# Patient Record
Sex: Female | Born: 2000 | Race: White | Hispanic: No | Marital: Single | Smoking: Light tobacco smoker
Health system: Southern US, Community
[De-identification: ages and names within clinical notes are randomized; demographics above are authoritative.]

---
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
## 2020-01-01 ENCOUNTER — Emergency Department: Payer: BC Managed Care – PPO

## 2020-01-01 ENCOUNTER — Encounter: Payer: Self-pay | Admitting: Internal Medicine

## 2020-01-01 ENCOUNTER — Inpatient Hospital Stay
Admission: EM | Admit: 2020-01-01 | Discharge: 2020-01-04 | DRG: 872 | Disposition: A | Discharge status: Home / Self Care | Payer: BC Managed Care – PPO | Attending: Internal Medicine | Admitting: Internal Medicine

## 2020-01-01 ENCOUNTER — Other Ambulatory Visit: Payer: Self-pay

## 2020-01-01 DIAGNOSIS — A419 Sepsis, unspecified organism: Secondary | ICD-10-CM | POA: Diagnosis present

## 2020-01-01 DIAGNOSIS — D509 Iron deficiency anemia, unspecified: Secondary | ICD-10-CM | POA: Diagnosis present

## 2020-01-01 DIAGNOSIS — A4151 Sepsis due to Escherichia coli [E. coli]: Principal | ICD-10-CM | POA: Diagnosis present

## 2020-01-01 DIAGNOSIS — R109 Unspecified abdominal pain: Secondary | ICD-10-CM

## 2020-01-01 DIAGNOSIS — N390 Urinary tract infection, site not specified: Secondary | ICD-10-CM

## 2020-01-01 DIAGNOSIS — Z20822 Contact with and (suspected) exposure to covid-19: Secondary | ICD-10-CM | POA: Diagnosis present

## 2020-01-01 DIAGNOSIS — N16 Renal tubulo-interstitial disorders in diseases classified elsewhere: Secondary | ICD-10-CM | POA: Diagnosis present

## 2020-01-01 DIAGNOSIS — E876 Hypokalemia: Secondary | ICD-10-CM | POA: Diagnosis present

## 2020-01-01 DIAGNOSIS — N12 Tubulo-interstitial nephritis, not specified as acute or chronic: Secondary | ICD-10-CM

## 2020-01-01 LAB — URINALYSIS, COMPLETE (UACMP) WITH MICROSCOPIC
Bilirubin Urine: NEGATIVE
Glucose, UA: NEGATIVE mg/dL
Hgb urine dipstick: NEGATIVE
Ketones, ur: 5 mg/dL — AB
Nitrite: NEGATIVE
Protein, ur: 30 mg/dL — AB
Specific Gravity, Urine: 1.011 (ref 1.005–1.030)
WBC, UA: >50 WBC/hpf — ABNORMAL HIGH (ref 0–5)
pH: 6 (ref 5.0–8.0)

## 2020-01-01 LAB — COMPREHENSIVE METABOLIC PANEL
ALT: 13 U/L (ref 0–44)
AST: 17 U/L (ref 15–41)
Albumin: 4.3 g/dL (ref 3.5–5.0)
Alkaline Phosphatase: 77 U/L (ref 38–126)
Anion gap: 14 (ref 5–15)
BUN: 10 mg/dL (ref 6–20)
CO2: 20 mmol/L — ABNORMAL LOW (ref 22–32)
Calcium: 9 mg/dL (ref 8.9–10.3)
Chloride: 101 mmol/L (ref 98–111)
Creatinine, Ser: 1.13 mg/dL — ABNORMAL HIGH (ref 0.44–1.00)
GFR calc Af Amer: >60 mL/min (ref >60)
GFR calc non Af Amer: >60 mL/min (ref >60)
Glucose, Bld: 106 mg/dL — ABNORMAL HIGH (ref 70–99)
Potassium: 3.4 mmol/L — ABNORMAL LOW (ref 3.5–5.1)
Sodium: 135 mmol/L (ref 135–145)
Total Bilirubin: 1.3 mg/dL — ABNORMAL HIGH (ref 0.3–1.2)
Total Protein: 8.7 g/dL — ABNORMAL HIGH (ref 6.5–8.1)

## 2020-01-01 LAB — CBC WITH DIFFERENTIAL/PLATELET
Abs Immature Granulocytes: 0.09 10*3/uL — ABNORMAL HIGH (ref 0.00–0.07)
Basophils Absolute: 0 10*3/uL (ref 0.0–0.1)
Basophils Relative: 0 %
Eosinophils Absolute: 0 10*3/uL (ref 0.0–0.5)
Eosinophils Relative: 0 %
HCT: 35.5 % — ABNORMAL LOW (ref 36.0–46.0)
Hemoglobin: 12.2 g/dL (ref 12.0–15.0)
Immature Granulocytes: 0 %
Lymphocytes Relative: 9 %
Lymphs Abs: 1.9 10*3/uL (ref 0.7–4.0)
MCH: 30 pg (ref 26.0–34.0)
MCHC: 34.4 g/dL (ref 30.0–36.0)
MCV: 87.2 fL (ref 80.0–100.0)
Monocytes Absolute: 1.1 10*3/uL — ABNORMAL HIGH (ref 0.1–1.0)
Monocytes Relative: 5 %
Neutro Abs: 18 10*3/uL — ABNORMAL HIGH (ref 1.7–7.7)
Neutrophils Relative %: 86 %
Platelets: 270 10*3/uL (ref 150–400)
RBC: 4.07 MIL/uL (ref 3.87–5.11)
RDW: 11.6 % (ref 11.5–15.5)
WBC: 21.2 10*3/uL — ABNORMAL HIGH (ref 4.0–10.5)
nRBC: 0 % (ref 0.0–0.2)

## 2020-01-01 LAB — LACTIC ACID, PLASMA: Lactic Acid, Venous: 2.4 mmol/L (ref 0.5–1.9)

## 2020-01-01 LAB — POCT PREGNANCY, URINE: Preg Test, Ur: NEGATIVE

## 2020-01-01 MED ORDER — IBUPROFEN 600 MG PO TABS
600.0000 mg | ORAL_TABLET | Freq: Four times a day (QID) | ORAL | Status: DC | PRN
  Administered 2020-01-02: 05:00:00 600 mg via ORAL
  Filled 2020-01-01: qty 1

## 2020-01-01 MED ORDER — SODIUM CHLORIDE 0.9 % IV BOLUS
2000.0000 mL | Freq: Once | INTRAVENOUS | Status: AC
  Administered 2020-01-01: 22:00:00 2000 mL via INTRAVENOUS

## 2020-01-01 MED ORDER — ACETAMINOPHEN 325 MG PO TABS
650.0000 mg | ORAL_TABLET | Freq: Four times a day (QID) | ORAL | Status: DC | PRN
  Administered 2020-01-02 (×3): 650 mg via ORAL
  Filled 2020-01-01 (×3): qty 2

## 2020-01-01 MED ORDER — SODIUM CHLORIDE 0.9 % IV SOLN
1.0000 g | Freq: Once | INTRAVENOUS | Status: AC
  Administered 2020-01-01: 1 g via INTRAVENOUS
  Filled 2020-01-01: qty 10

## 2020-01-01 MED ORDER — SODIUM CHLORIDE 0.9 % IV SOLN: Freq: Once | INTRAVENOUS | Status: AC

## 2020-01-01 MED ORDER — ONDANSETRON HCL 4 MG PO TABS
4.0000 mg | ORAL_TABLET | Freq: Four times a day (QID) | ORAL | Status: DC | PRN
  Administered 2020-01-02 – 2020-01-04 (×3): 4 mg via ORAL
  Filled 2020-01-01 (×3): qty 1

## 2020-01-01 MED ORDER — ACETAMINOPHEN 500 MG PO TABS: 1000.0000 mg | ORAL_TABLET | Freq: Once | ORAL | Status: DC

## 2020-01-01 MED ORDER — POTASSIUM CHLORIDE 20 MEQ/15ML (10%) PO SOLN
40.0000 meq | Freq: Once | ORAL | Status: AC
  Administered 2020-01-02: 02:00:00 40 meq via ORAL
  Filled 2020-01-01 (×2): qty 30

## 2020-01-01 MED ORDER — ENOXAPARIN SODIUM 40 MG/0.4ML ~~LOC~~ SOLN
40.0000 mg | SUBCUTANEOUS | Status: DC
  Filled 2020-01-01: qty 0.4

## 2020-01-01 MED ORDER — SODIUM CHLORIDE 0.9% FLUSH: 3.0000 mL | Freq: Once | INTRAVENOUS | Status: DC

## 2020-01-01 MED ORDER — FENTANYL CITRATE (PF) 100 MCG/2ML IJ SOLN
50.0000 ug | INTRAMUSCULAR | Status: DC | PRN
  Administered 2020-01-01: 20:00:00 50 ug via INTRAVENOUS
  Filled 2020-01-01: qty 2

## 2020-01-01 MED ORDER — IBUPROFEN 600 MG PO TABS
600.0000 mg | ORAL_TABLET | Freq: Once | ORAL | Status: AC
  Administered 2020-01-01: 22:00:00 600 mg via ORAL
  Filled 2020-01-01: qty 1

## 2020-01-01 MED ORDER — ONDANSETRON HCL 4 MG/2ML IJ SOLN: 4.0000 mg | Freq: Four times a day (QID) | INTRAMUSCULAR | Status: DC | PRN

## 2020-01-01 MED ORDER — ACETAMINOPHEN 650 MG RE SUPP: 650.0000 mg | Freq: Four times a day (QID) | RECTAL | Status: DC | PRN

## 2020-01-01 MED ORDER — SODIUM CHLORIDE 0.9 % IV SOLN
1.0000 g | INTRAVENOUS | Status: DC
  Administered 2020-01-02 – 2020-01-04 (×3): 1 g via INTRAVENOUS
  Filled 2020-01-01 (×3): qty 1

## 2020-01-01 NOTE — ED Notes (Signed)
ED Provider Posey Pronto at bedside.

## 2020-01-01 NOTE — ED Notes (Signed)
MD aware that patient is refusing a second IV or blood draw, is OK with the 1 set of cultures obtained

## 2020-01-01 NOTE — ED Notes (Signed)
Pt provide with meal tray and water by this RN per MD Posey Pronto.

## 2020-01-01 NOTE — ED Provider Notes (Signed)
[facility] Emergency Department Provider Note  ____________________________________________   First MD Initiated Contact with Patient 01/01/20 2107     (approximate)  I have reviewed the triage vital signs and the nursing notes.  History  Chief Complaint Flank Pain    HPI Cindy Alexander is a 19 y.o. female who presents emergency department for lower back pain, right flank pain.  Symptoms have been ongoing for several weeks.  Pain is primarily located to her right flank/back.  8/10 in severity, described as an aching.  No radiation.  No alleviating/aggravating factors.  Severity seems to wax and wane with no identifiable trigger.  She presented to urgent care today, where she was diagnosed with pyelonephritis and referred to the emergency department for further evaluation.  She denies any frank dysuria or malodorous urine.  No nausea, vomiting, diarrhea.  She denies any difficulty breathing, cough, shortness of breath, or sick contacts.  Patient does report a fever at urgent care, temp 99 Fahrenheit on arrival after taking APAP.   Past Medical Hx No past medical history on file.  Problem List There are no problems to display for this patient.   Past Surgical Hx Non-contributory.  Medications Prior to Admission medications   Not on File    Allergies Patient has no allergy information on record.  Family Hx No family history on file.  Social Hx Social History   Tobacco Use  . Smoking status: Not on file  Substance Use Topics  . Alcohol use: Not on file  . Drug use: Not on file     Review of Systems  Constitutional: Negative for fever, chills. Eyes: Negative for visual changes. ENT: Negative for sore throat. Cardiovascular: Negative for chest pain. Respiratory: Negative for shortness of breath. Gastrointestinal: Negative for nausea, vomiting.  Genitourinary: Negative for dysuria. + RIGHT flank pain Musculoskeletal: Negative for leg  swelling. Skin: Negative for rash. Neurological: Negative for for headaches.   Physical Exam  Vital Signs: ED Triage Vitals  Enc Vitals Group     BP 01/01/20 2007 (!) 139/110     Pulse Rate 01/01/20 2007 (!) 128     Resp 01/01/20 2007 (!) 25     Temp 01/01/20 2007 99 F (37.2 C)     Temp Source 01/01/20 2007 Oral     SpO2 01/01/20 2007 100 %     Weight 01/01/20 2008 103 lb (46.7 kg)     Height 01/01/20 2008 5\' 5"  (1.651 m)     Head Circumference --      Peak Flow --      Pain Score 01/01/20 2007 8     Pain Loc --      Pain Edu? --      Excl. in [place]? --     Constitutional: Alert and oriented.  Head: Normocephalic. Atraumatic. Eyes: Conjunctivae clear. Sclera anicteric. Nose: No congestion. No rhinorrhea. Mouth/Throat: Wearing mask.  Neck: No stridor.   Cardiovascular: Normal rate, regular rhythm. Extremities well perfused. Respiratory: Normal respiratory effort.  Lungs CTAB. Gastrointestinal: Soft. Non-tender. Non-distended.  Notable R CVA tenderness. Musculoskeletal: No lower extremity edema. No deformities. Neurologic:  Normal speech and language. No gross focal neurologic deficits are appreciated.  Skin: Skin is warm, dry and intact. No rash noted. Psychiatric: Mood and affect are appropriate for situation.    Radiology  CT renal: IMPRESSION: 1. There is some mild right-sided perinephric fat stranding without evidence for significant right-sided hydronephrosis or radiopaque kidney stone. This could be secondary  to a recently passed stone or pyelonephritis. Correlate with urinalysis. Examination was limited by lack of IV contrast. 2. Small volume of pelvic free fluid is likely physiologic or reactive. 3. IUD in place. 4. The appendix is not reliably identified.    Procedures  Procedure(s) performed (including critical care):  .Critical Care Performed by: Lilia Pro., MD Authorized by: Lilia Pro., MD   Critical care provider statement:     Critical care time (minutes):  45   Critical care was necessary to treat or prevent imminent or life-threatening deterioration of the following conditions:  Sepsis   Critical care was time spent personally by me on the following activities:  Discussions with consultants, evaluation of patient's response to treatment, examination of patient, ordering and performing treatments and interventions, ordering and review of laboratory studies, ordering and review of radiographic studies, pulse oximetry, re-evaluation of patient's condition, obtaining history from patient or surrogate and review of old charts     Initial Impression / Assessment and Plan / ED Course  19 y.o. female who presents to the ED for R flank pain, concerning for pyelonephritis  Ddx: pyelonephritis, stone  Labs reveal significant leukocytosis to 21.  AKI with creatinine 1.13.  Lactic acid 2.4.  UA with large LE, WBCs, bacteria.  Given her lab values, tachycardia on arrival, patient does meet sepsis criteria.  Will give IV fluids, antibiotics, plan for admission.  Patient agreeable with plan.  Discussed with hospitalist for admission.  CT renal ordered to rule out concomitant stone, negative for nephrolithiasis, consistent with pyelonephritis.   Final Clinical Impression(s) / ED Diagnosis  Final diagnoses:  Right flank pain  Pyelonephritis       Note:  This document was prepared using Dragon voice recognition software and may include unintentional dictation errors.   Lilia Pro., MD 01/01/20 [phone]

## 2020-01-01 NOTE — ED Notes (Signed)
Patient transported to CT 

## 2020-01-01 NOTE — ED Notes (Signed)
Pt refuses 2nd IV and blow draw at this time. EDP [facility] made aware.

## 2020-01-01 NOTE — ED Notes (Signed)
Pt able to ambulate to bathroom with a steady gait.

## 2020-01-01 NOTE — H&P (Signed)
History and Physical    Cindy Alexander [id number] DOB: December 29, 2000 DOA: 01/01/2020  PCP: Patient, No Pcp Per  Patient coming from: Home  I have personally briefly reviewed patient's old medical records in [place]  Chief Complaint: Right flank pain  HPI: Cindy Alexander is a 19 y.o. female without known significant medical history who presents to the ED for evaluation of right flank pain.  Patient states over the last several weeks she has been having intermittent flank pain.  She first noted having left-sided flank pain on and off which has been resolving on its own.  She now has developed severe right-sided flank pain.  She reports associated fevers, chills, diaphoresis, abdominal pain.  She was having nausea without emesis.  She denies any dysuria or polyuria.  She denies any chest pain or dyspnea.  She reports occasional tobacco use with maximum of 4 cigarettes/month.  She uses marijuana occasionally.  She reports occasional alcohol use.  She denies any cocaine or IV drug use.  She reports a history of epilepsy in her brother.  She denies any known allergies to medication.  ED Course:  Initial vitals showed BP 139/110, pulse 128, RR 25, temp 99.0 Fahrenheit, SPO2 100% on room air.  Labs are notable for WBC 21.2, hemoglobin 12.2, platelets 270,000, lactic acid 2.4, potassium 3.4, sodium 135, bicarb 20, BUN 10, creatinine 1.13, AST 17, ALT 13, alk phos 77, total bilirubin 1.3, serum glucose 106.  Urinalysis showed negative nitrites, large leukocytes, 0-5 RBCs, > 50 WBCs, rare bacteria microscopy.  Urine pregnancy test is negative.  Blood and urine cultures were obtained and pending.  SARS-CoV-2 PCR test was ordered and pending.  CT renal stone study showed right-sided perinephric fat stranding without obvious radiopaque kidney stone or hydronephrosis.  Patient was given 2 L normal saline, fentanyl, ibuprofen, and order to receive IV ceftriaxone.  The hospitalist service was  consulted admit for further evaluation and management.  Review of Systems: All systems reviewed and are negative except as documented in history of present illness above.   History reviewed. No pertinent past medical history.  History reviewed. No pertinent surgical history.  Social History:  reports that she has been smoking. She has never used smokeless tobacco. She reports current alcohol use. She reports current drug use. Drug: Marijuana.  No Known Allergies  Family History  Problem Relation Age of Onset  . Epilepsy Brother      Prior to Admission medications   Not on File    Physical Exam: Vitals:   01/01/20 2110 01/01/20 2124 01/01/20 2130 01/01/20 2217  BP:  103/66 107/71   Pulse: (!) 110 96 95   Resp:   19   Temp:    98.5 F (36.9 C)  TempSrc:    Oral  SpO2: 99% 98% 99%   Weight:      Height:       Constitutional: Resting in bed with head elevated, NAD, calm, comfortable Eyes: PERRL, lids and conjunctivae normal ENMT: Mucous membranes are moist. Posterior pharynx clear of any exudate or lesions.Normal dentition.  Neck: normal, supple, no masses. Respiratory: clear to auscultation bilaterally, no wheezing, no crackles. Normal respiratory effort. No accessory muscle use.  Cardiovascular: Regular rate and rhythm, no murmurs / rubs / gallops. No extremity edema. 2+ pedal pulses. Abdomen: Bilateral CVA tenderness, generalized abdominal tenderness to palpation, no masses palpated. No hepatosplenomegaly. Musculoskeletal: no clubbing / cyanosis. No joint deformity upper and lower extremities. Good ROM, no contractures. Normal  muscle tone.  Skin: no rashes, lesions, ulcers. No induration Neurologic: CN 2-12 grossly intact. Sensation intact, Strength 5/5 in all 4.  Psychiatric: Normal judgment and insight. Alert and oriented x 3. Normal mood.   Labs on Admission: I have personally reviewed following labs and imaging studies  CBC: Recent Labs  Lab 01/01/20 2025    WBC 21.2*  NEUTROABS 18.0*  HGB 12.2  HCT 35.5*  MCV 87.2  PLT 562   Basic Metabolic Panel: Recent Labs  Lab 01/01/20 2025  NA 135  K 3.4*  CL 101  CO2 20*  GLUCOSE 106*  BUN 10  CREATININE 1.13*  CALCIUM 9.0   GFR: Estimated Creatinine Clearance: 59 mL/min (A) (by C-G formula based on SCr of 1.13 mg/dL (H)). Liver Function Tests: Recent Labs  Lab 01/01/20 2025  AST 17  ALT 13  ALKPHOS 77  BILITOT 1.3*  PROT 8.7*  ALBUMIN 4.3   No results for input(s): LIPASE, AMYLASE in the last 168 hours. No results for input(s): AMMONIA in the last 168 hours. Coagulation Profile: No results for input(s): INR, PROTIME in the last 168 hours. Cardiac Enzymes: No results for input(s): CKTOTAL, CKMB, CKMBINDEX, TROPONINI in the last 168 hours. BNP (last 3 results) No results for input(s): PROBNP in the last 8760 hours. HbA1C: No results for input(s): HGBA1C in the last 72 hours. CBG: No results for input(s): GLUCAP in the last 168 hours. Lipid Profile: No results for input(s): CHOL, HDL, LDLCALC, TRIG, CHOLHDL, LDLDIRECT in the last 72 hours. Thyroid Function Tests: No results for input(s): TSH, T4TOTAL, FREET4, T3FREE, THYROIDAB in the last 72 hours. Anemia Panel: No results for input(s): VITAMINB12, FOLATE, FERRITIN, TIBC, IRON, RETICCTPCT in the last 72 hours. Urine analysis:    Component Value Date/Time   COLORURINE YELLOW (A) 01/01/2020 2123   APPEARANCEUR HAZY (A) 01/01/2020 2123   LABSPEC 1.011 01/01/2020 2123   PHURINE 6.0 01/01/2020 2123   GLUCOSEU NEGATIVE 01/01/2020 2123   HGBUR NEGATIVE 01/01/2020 2123   BILIRUBINUR NEGATIVE 01/01/2020 2123   KETONESUR 5 (A) 01/01/2020 2123   PROTEINUR 30 (A) 01/01/2020 2123   NITRITE NEGATIVE 01/01/2020 2123   LEUKOCYTESUR LARGE (A) 01/01/2020 2123    Radiological Exams on Admission: CT Renal Stone Study  Result Date: 01/01/2020 CLINICAL DATA:  Right flank pain for multiple weeks. Recent diagnosis of pyelonephritis.  EXAM: CT ABDOMEN AND PELVIS WITHOUT CONTRAST TECHNIQUE: Multidetector CT imaging of the abdomen and pelvis was performed following the standard protocol without IV contrast. COMPARISON:  None. FINDINGS: Lower chest: The lung bases are clear. The heart size is normal. Hepatobiliary: The liver is normal. Normal gallbladder.There is no biliary ductal dilation. Pancreas: Normal contours without ductal dilatation. No peripancreatic fluid collection. Spleen: No splenic laceration or hematoma. Adrenals/Urinary Tract: --Adrenal glands: No adrenal hemorrhage. --Right kidney/ureter: Is some mild right-sided perinephric fat stranding without evidence for significant right-sided hydronephrosis. There is no evidence for right-sided radiopaque kidney stone. --Left kidney/ureter: No hydronephrosis or perinephric hematoma. --Urinary bladder: Unremarkable. Stomach/Bowel: --Stomach/Duodenum: No hiatal hernia or other gastric abnormality. Normal duodenal course and caliber. --Small bowel: No dilatation or inflammation. --Colon: No focal abnormality. --Appendix: Not visualized. No right lower quadrant inflammation or free fluid. Vascular/Lymphatic: Normal course and caliber of the major abdominal vessels. --No retroperitoneal lymphadenopathy. --No mesenteric lymphadenopathy. --No pelvic or inguinal lymphadenopathy. Reproductive: There is an IUD in place. Other: There is a small volume of pelvic free fluid which is likely physiologic. No free air. The abdominal wall is normal. Musculoskeletal.  No acute displaced fractures. IMPRESSION: 1. There is some mild right-sided perinephric fat stranding without evidence for significant right-sided hydronephrosis or radiopaque kidney stone. This could be secondary to a recently passed stone or pyelonephritis. Correlate with urinalysis. Examination was limited by lack of IV contrast. 2. Small volume of pelvic free fluid is likely physiologic or reactive. 3. IUD in place. 4. The appendix is not  reliably identified. Electronically Signed   By: Constance Holster M.D.   On: 01/01/2020 22:14    EKG: Not performed.  Assessment/Plan Principal Problem:   Sepsis due to urinary tract infection (HCC) Active Problems:   Hypokalemia  Niki Payment Dilley is a 19 y.o. female without known significant medical history who is admitted for sepsis due to pyelonephritis.  Sepsis due to pyelonephritis: Patient presenting with leukocytosis, lactic acidosis, tachycardia in urine/exam findings suggestive of UTI/pyelonephritis.  CT renal stone study without obvious renal stone. -Continue IV ceftriaxone -S/p 3 L fluids, hold further fluids -Follow urine and blood cultures -Continue pain control as needed  Hypokalemia: Replete orally and recheck in a.m.  DVT prophylaxis: Lovenox Code Status: Full code Family Communication: Discussed with patient, she has discussed with her family Disposition Plan: Likely discharge to home in 1 day if hemodynamically stable and able to transition to oral antibiotics. Admission status: Observation   Zada Finders MD [facility]  If 7PM-7AM, please contact night-coverage [email or web address]  01/01/2020, 10:58 PM

## 2020-01-01 NOTE — ED Triage Notes (Signed)
Right flank pain for multiple weeks. Pt was not seen until [facility] went [facility]. Pt dx with Pyelonephritis and sent for further evaluation. Pt appears very uncomfortable and unable to sit still. No NVD. Pt reporting a fever at UC temp of 99 upon arrival.

## 2020-01-01 NOTE — ED Notes (Signed)
Pt st she had tylenol at urgent care today at [place].

## 2020-01-02 DIAGNOSIS — E876 Hypokalemia: Secondary | ICD-10-CM | POA: Diagnosis present

## 2020-01-02 DIAGNOSIS — D509 Iron deficiency anemia, unspecified: Secondary | ICD-10-CM | POA: Diagnosis present

## 2020-01-02 DIAGNOSIS — A4151 Sepsis due to Escherichia coli [E. coli]: Secondary | ICD-10-CM | POA: Diagnosis present

## 2020-01-02 DIAGNOSIS — A419 Sepsis, unspecified organism: Secondary | ICD-10-CM | POA: Diagnosis not present

## 2020-01-02 DIAGNOSIS — N390 Urinary tract infection, site not specified: Secondary | ICD-10-CM | POA: Diagnosis not present

## 2020-01-02 DIAGNOSIS — Z20822 Contact with and (suspected) exposure to covid-19: Secondary | ICD-10-CM | POA: Diagnosis present

## 2020-01-02 DIAGNOSIS — R109 Unspecified abdominal pain: Secondary | ICD-10-CM | POA: Diagnosis present

## 2020-01-02 DIAGNOSIS — N16 Renal tubulo-interstitial disorders in diseases classified elsewhere: Secondary | ICD-10-CM | POA: Diagnosis present

## 2020-01-02 LAB — CBC
HCT: 30.9 % — ABNORMAL LOW (ref 36.0–46.0)
Hemoglobin: 10.2 g/dL — ABNORMAL LOW (ref 12.0–15.0)
MCH: 30.3 pg (ref 26.0–34.0)
MCHC: 33 g/dL (ref 30.0–36.0)
MCV: 91.7 fL (ref 80.0–100.0)
Platelets: 173 10*3/uL (ref 150–400)
RBC: 3.37 MIL/uL — ABNORMAL LOW (ref 3.87–5.11)
RDW: 11.9 % (ref 11.5–15.5)
WBC: 18 10*3/uL — ABNORMAL HIGH (ref 4.0–10.5)
nRBC: 0 % (ref 0.0–0.2)

## 2020-01-02 LAB — BASIC METABOLIC PANEL
Anion gap: 8 (ref 5–15)
BUN: 7 mg/dL (ref 6–20)
CO2: 21 mmol/L — ABNORMAL LOW (ref 22–32)
Calcium: 7.7 mg/dL — ABNORMAL LOW (ref 8.9–10.3)
Chloride: 112 mmol/L — ABNORMAL HIGH (ref 98–111)
Creatinine, Ser: 0.66 mg/dL (ref 0.44–1.00)
GFR calc Af Amer: >60 mL/min (ref >60)
GFR calc non Af Amer: >60 mL/min (ref >60)
Glucose, Bld: 99 mg/dL (ref 70–99)
Potassium: 4.3 mmol/L (ref 3.5–5.1)
Sodium: 141 mmol/L (ref 135–145)

## 2020-01-02 LAB — HIV ANTIBODY (ROUTINE TESTING W REFLEX): HIV Screen 4th Generation wRfx: NONREACTIVE

## 2020-01-02 LAB — LACTIC ACID, PLASMA: Lactic Acid, Venous: 0.9 mmol/L (ref 0.5–1.9)

## 2020-01-02 LAB — SARS CORONAVIRUS 2 (TAT 6-24 HRS): SARS Coronavirus 2: NEGATIVE

## 2020-01-02 MED ORDER — ACETAMINOPHEN 650 MG RE SUPP: 650.0000 mg | Freq: Four times a day (QID) | RECTAL | Status: DC | PRN

## 2020-01-02 MED ORDER — OXYCODONE HCL 5 MG PO TABS
5.0000 mg | ORAL_TABLET | ORAL | Status: DC | PRN
  Administered 2020-01-02 – 2020-01-04 (×12): 5 mg via ORAL
  Filled 2020-01-02 (×12): qty 1

## 2020-01-02 MED ORDER — KETOROLAC TROMETHAMINE 30 MG/ML IJ SOLN
30.0000 mg | Freq: Three times a day (TID) | INTRAMUSCULAR | Status: DC | PRN
  Administered 2020-01-02 – 2020-01-03 (×2): 30 mg via INTRAVENOUS
  Filled 2020-01-02 (×2): qty 1

## 2020-01-02 MED ORDER — MORPHINE SULFATE (PF) 2 MG/ML IV SOLN: 0.5000 mg | INTRAVENOUS | Status: DC | PRN

## 2020-01-02 MED ORDER — SODIUM CHLORIDE 0.9 % IV SOLN
INTRAVENOUS | Status: DC | PRN
  Administered 2020-01-02 – 2020-01-04 (×2): 250 mL via INTRAVENOUS

## 2020-01-02 MED ORDER — INFLUENZA VAC SPLIT QUAD 0.5 ML IM SUSY
0.5000 mL | PREFILLED_SYRINGE | INTRAMUSCULAR | Status: DC
  Filled 2020-01-02: qty 0.5

## 2020-01-02 MED ORDER — ACETAMINOPHEN 325 MG PO TABS
650.0000 mg | ORAL_TABLET | ORAL | Status: DC | PRN
  Administered 2020-01-02 – 2020-01-04 (×7): 650 mg via ORAL
  Filled 2020-01-02 (×8): qty 2

## 2020-01-02 NOTE — Progress Notes (Signed)
Patient refused labs to be drawn. Nurse explained to patient importance of getting the labs drawn and patient explained she wanted to wait until later in the morning and maybe she would change her mind. She said she has an extreme fear of doctors and needles.   VSS. Pain is under control per patient. She is up ad lib and voiding regularly. Drinking fluids and eating snacks. Excited about ordering breakfast this morning.   Will continue to monitor.
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
## 2020-01-02 NOTE — Progress Notes (Signed)
PROGRESS NOTE    Cindy Alexander  [id number] DOB: July 05, 2001 DOA: 01/01/2020 PCP: Patient, No Pcp Per   Brief Narrative:  Cindy Alexander is a 19 y.o. female without known significant medical history who presents to the ED for evaluation of right flank pain.  Patient states over the last several weeks she has been having intermittent flank pain. UA with leukocytosis and some bacteria.  CT abdomen concerning for pyelonephritis, no renal stones.  Subjective: Patient was feeling better when seen this morning.  Her pain is improving although continues to experience some right flank and right upper quadrant pain.  No nausea or vomiting.  Assessment & Plan:   Principal Problem:   Sepsis due to urinary tract infection ([place]) Active Problems:   Hypokalemia  Pyelonephritis.  Patient remained afebrile with improving leukocytosis. Clinically seems improving. Urine culture results are pending. -Start her on Toradol as needed for pain. -She will continue on ceftriaxone till will have culture results. -Encouraged p.o. hydration.  Hypokalemia.  Resolved today.  Objective: Vitals:   01/02/20 0117 01/02/20 0500 01/02/20 0815 01/02/20 1245  BP: (!) 94/58 98/69 (!) 86/61 99/63  Pulse: 85 84 77 80  Resp:  19 20 18   Temp:  (!) 97.1 F (36.2 C) (!) 97.5 F (36.4 C) 97.8 F (36.6 C)  TempSrc:  Oral Oral Oral  SpO2:  100%  99%  Weight:      Height:        Intake/Output Summary (Last 24 hours) at 01/02/2020 1513 Last data filed at 01/02/2020 1247 Gross per 24 hour  Intake 582.36 ml  Output 1400 ml  Net -817.64 ml   Filed Weights   01/01/20 2008 01/01/20 2018  Weight: 46.7 kg 46.7 kg    Examination:  General exam: Appears calm and comfortable  Respiratory system: Clear to auscultation. Respiratory effort normal. Cardiovascular system: S1 & S2 heard, RRR. No JVD, murmurs, rubs, gallops or clicks. Gastrointestinal system: Soft, right upper quadrant and right flank tenderness,  nondistended, bowel sounds positive. Central nervous system: Alert and oriented. No focal neurological deficits.Symmetric 5 x 5 power. Extremities: No edema, no cyanosis, pulses intact and symmetrical. Skin: No rashes, lesions or ulcers Psychiatry: Judgement and insight appear normal. Mood & affect appropriate.   DVT prophylaxis: Lovenox Code Status: Full Family Communication: Updated mother at bedside. Disposition Plan: Pending improvement and culture results, most likely tomorrow.  Consultants:   None  Procedures:  Antimicrobials:  Ceftriaxone  Data Reviewed: I have personally reviewed following labs and imaging studies  CBC: Recent Labs  Lab 01/01/20 2025 01/02/20 0851  WBC 21.2* 18.0*  NEUTROABS 18.0*  --   HGB 12.2 10.2*  HCT 35.5* 30.9*  MCV 87.2 91.7  PLT 270 [id number]   Basic Metabolic Panel: Recent Labs  Lab 01/01/20 2025 01/02/20 0851  NA 135 141  K 3.4* 4.3  CL 101 112*  CO2 20* 21*  GLUCOSE 106* 99  BUN 10 7  CREATININE 1.13* 0.66  CALCIUM 9.0 7.7*   GFR: Estimated Creatinine Clearance: 83.4 mL/min (by C-G formula based on SCr of 0.66 mg/dL). Liver Function Tests: Recent Labs  Lab 01/01/20 2025  AST 17  ALT 13  ALKPHOS 77  BILITOT 1.3*  PROT 8.7*  ALBUMIN 4.3   No results for input(s): LIPASE, AMYLASE in the last 168 hours. No results for input(s): AMMONIA in the last 168 hours. Coagulation Profile: No results for input(s): INR, PROTIME in the last 168 hours. Cardiac Enzymes: No results for input(s):  CKTOTAL, CKMB, CKMBINDEX, TROPONINI in the last 168 hours. BNP (last 3 results) No results for input(s): PROBNP in the last 8760 hours. HbA1C: No results for input(s): HGBA1C in the last 72 hours. CBG: No results for input(s): GLUCAP in the last 168 hours. Lipid Profile: No results for input(s): CHOL, HDL, LDLCALC, TRIG, CHOLHDL, LDLDIRECT in the last 72 hours. Thyroid Function Tests: No results for input(s): TSH, T4TOTAL, FREET4, T3FREE,  THYROIDAB in the last 72 hours. Anemia Panel: No results for input(s): VITAMINB12, FOLATE, FERRITIN, TIBC, IRON, RETICCTPCT in the last 72 hours. Sepsis Labs: Recent Labs  Lab 01/01/20 2025 01/01/20 2328  LATICACIDVEN 2.4* 0.9    Recent Results (from the past 240 hour(s))  Culture, blood (routine x 2)     Status: None (Preliminary result)   Collection Time: 01/01/20  8:25 PM   Specimen: BLOOD  Result Value Ref Range Status   Specimen Description BLOOD RIGHT ASSIST CONTROL  Final   Special Requests   Final    BOTTLES DRAWN AEROBIC AND ANAEROBIC Blood Culture adequate volume   Culture   Final    NO GROWTH < 12 HOURS Performed at [facility], [address], [place], [place] [address]    Report Status PENDING  Incomplete  SARS CORONAVIRUS 2 (TAT 6-24 HRS) Nasopharyngeal Nasopharyngeal Swab     Status: None   Collection Time: 01/01/20 10:20 PM   Specimen: Nasopharyngeal Swab  Result Value Ref Range Status   SARS Coronavirus 2 NEGATIVE NEGATIVE Final    Comment: (NOTE) SARS-CoV-2 target nucleic acids are NOT DETECTED. The SARS-CoV-2 RNA is generally detectable in upper and lower respiratory specimens during the acute phase of infection. Negative results do not preclude SARS-CoV-2 infection, do not rule out co-infections with other pathogens, and should not be used as the sole basis for treatment or other patient management decisions. Negative results must be combined with clinical observations, patient history, and epidemiological information. The expected result is Negative. Fact Sheet for Patients: SugarRoll.be Fact Sheet for Healthcare Providers: https://www.woods-mathews.com/ This test is not yet approved or cleared by the [place] FDA and  has been authorized for detection and/or diagnosis of SARS-CoV-2 by FDA under an Emergency Use Authorization (EUA). This EUA will remain  in effect (meaning this test can be used)  for the duration of the COVID-19 declaration under Section 56 4(b)(1) of the Act, 21 U.S.C. section 360bbb-3(b)(1), unless the authorization is terminated or revoked sooner. Performed at [facility], [place] [address] [place], [place] [address]      Radiology Studies: CT Renal Stone Study  Result Date: 01/01/2020 CLINICAL DATA:  Right flank pain for multiple weeks. Recent diagnosis of pyelonephritis. EXAM: CT ABDOMEN AND PELVIS WITHOUT CONTRAST TECHNIQUE: Multidetector CT imaging of the abdomen and pelvis was performed following the standard protocol without IV contrast. COMPARISON:  None. FINDINGS: Lower chest: The lung bases are clear. The heart size is normal. Hepatobiliary: The liver is normal. Normal gallbladder.There is no biliary ductal dilation. Pancreas: Normal contours without ductal dilatation. No peripancreatic fluid collection. Spleen: No splenic laceration or hematoma. Adrenals/Urinary Tract: --Adrenal glands: No adrenal hemorrhage. --Right kidney/ureter: Is some mild right-sided perinephric fat stranding without evidence for significant right-sided hydronephrosis. There is no evidence for right-sided radiopaque kidney stone. --Left kidney/ureter: No hydronephrosis or perinephric hematoma. --Urinary bladder: Unremarkable. Stomach/Bowel: --Stomach/Duodenum: No hiatal hernia or other gastric abnormality. Normal duodenal course and caliber. --Small bowel: No dilatation or inflammation. --Colon: No focal abnormality. --Appendix: Not visualized. No right  lower quadrant inflammation or free fluid. Vascular/Lymphatic: Normal course and caliber of the major abdominal vessels. --No retroperitoneal lymphadenopathy. --No mesenteric lymphadenopathy. --No pelvic or inguinal lymphadenopathy. Reproductive: There is an IUD in place. Other: There is a small volume of pelvic free fluid which is likely physiologic. No free air. The abdominal wall is normal. Musculoskeletal. No acute displaced fractures.  IMPRESSION: 1. There is some mild right-sided perinephric fat stranding without evidence for significant right-sided hydronephrosis or radiopaque kidney stone. This could be secondary to a recently passed stone or pyelonephritis. Correlate with urinalysis. Examination was limited by lack of IV contrast. 2. Small volume of pelvic free fluid is likely physiologic or reactive. 3. IUD in place. 4. The appendix is not reliably identified. Electronically Signed   By: Constance Holster M.D.   On: 01/01/2020 22:14    Scheduled Meds: . [START ON 01/03/2020] influenza vac split quadrivalent PF  0.5 mL Intramuscular Tomorrow-1000   Continuous Infusions: . sodium chloride Stopped (01/02/20 1018)  . cefTRIAXone (ROCEPHIN)  IV 1 g (01/02/20 0918)     LOS: 0 days   Time spent: 30 minutes.  Lorella Nimrod, MD [facility] Pager [phone]  If 7PM-7AM, please contact night-coverage [email or web address] Password [facility] 01/02/2020, 3:13 PM   This record has been created using Dragon voice recognition software. Errors have been sought and corrected,but may not always be located. Such creation errors do not reflect on the standard of care.

## 2020-01-03 LAB — CBC
HCT: 31 % — ABNORMAL LOW (ref 36.0–46.0)
Hemoglobin: 10.1 g/dL — ABNORMAL LOW (ref 12.0–15.0)
MCH: 30.1 pg (ref 26.0–34.0)
MCHC: 32.6 g/dL (ref 30.0–36.0)
MCV: 92.3 fL (ref 80.0–100.0)
Platelets: 175 10*3/uL (ref 150–400)
RBC: 3.36 MIL/uL — ABNORMAL LOW (ref 3.87–5.11)
RDW: 11.9 % (ref 11.5–15.5)
WBC: 18.3 10*3/uL — ABNORMAL HIGH (ref 4.0–10.5)
nRBC: 0 % (ref 0.0–0.2)

## 2020-01-03 LAB — IRON AND TIBC
Iron: 13 ug/dL — ABNORMAL LOW (ref 28–170)
Saturation Ratios: 7 % — ABNORMAL LOW (ref 10.4–31.8)
TIBC: 178 ug/dL — ABNORMAL LOW (ref 250–450)
UIBC: 165 ug/dL

## 2020-01-03 LAB — FERRITIN: Ferritin: 237 ng/mL (ref 11–307)

## 2020-01-03 MED ORDER — DOCUSATE SODIUM 100 MG PO CAPS
200.0000 mg | ORAL_CAPSULE | Freq: Two times a day (BID) | ORAL | Status: DC | PRN
  Administered 2020-01-03 – 2020-01-04 (×2): 200 mg via ORAL
  Filled 2020-01-03 (×2): qty 2

## 2020-01-03 NOTE — Plan of Care (Signed)
Vs better at end of shift; tmax of 102.3; has taken tylenol for temp; has also taken toradol and roxicodone for pain control; did have a headache pretty intense at some point this shift; does get lightheaded and dizzy when getting up and RN has asked pt to call out before getting up; pt "deathly afraid of needles" and has told nurses that she will have to be held down for lab draws
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
## 2020-01-03 NOTE — Progress Notes (Signed)
PROGRESS NOTE    Cindy Alexander  [id number] DOB: 2001-05-09 DOA: 01/01/2020 PCP: Patient, No Pcp Per   Brief Narrative:  Cindy Alexander is a 19 y.o. female without known significant medical history who presents to the ED for evaluation of right flank pain.  Patient states over the last several weeks she has been having intermittent flank pain. UA with leukocytosis and some bacteria.  CT abdomen concerning for pyelonephritis, no renal stones.  Subjective: Patient was feeling better when seen this morning.  Her pain is improving although continues to experience some right flank  pain.  No nausea or vomiting.  Assessment & Plan:   Principal Problem:   Sepsis due to urinary tract infection ([place]) Active Problems:   Hypokalemia  Pyelonephritis.  Patient had 1 spike of fever at 102.7 last night. Clinically seems improving. Urine culture growing E. Coli- susceptibility pending. Blood cultures negative. -Start her on Toradol as needed for pain. -She will continue on ceftriaxone till will have culture results. -Encouraged p.o. hydration.  Hypokalemia.  Resolved today.  Objective: Vitals:   01/03/20 0328 01/03/20 0815 01/03/20 1013 01/03/20 1150  BP: 91/63 (!) 89/63 107/74 99/70  Pulse: 99 97 98 95  Resp: 20 18  17   Temp: 98.7 F (37.1 C) 98.8 F (37.1 C)  99 F (37.2 C)  TempSrc: Oral Oral  Axillary  SpO2: 99% 100%    Weight:      Height:        Intake/Output Summary (Last 24 hours) at 01/03/2020 1423 Last data filed at 01/03/2020 1000 Gross per 24 hour  Intake 100 ml  Output 2800 ml  Net -2700 ml   Filed Weights   01/01/20 2008 01/01/20 2018  Weight: 46.7 kg 46.7 kg    Examination:  General exam: Appears calm and comfortable  Respiratory system: Clear to auscultation. Respiratory effort normal. Cardiovascular system: S1 & S2 heard, RRR. No JVD, murmurs, rubs, gallops or clicks. Gastrointestinal system: Soft, right flank tenderness, nondistended, bowel sounds  positive. Central nervous system: Alert and oriented. No focal neurological deficits.Symmetric 5 x 5 power. Extremities: No edema, no cyanosis, pulses intact and symmetrical. Skin: No rashes, lesions or ulcers Psychiatry: Judgement and insight appear normal. Mood & affect appropriate.   DVT prophylaxis: Lovenox Code Status: Full Family Communication: Updated mother at bedside. Disposition Plan: Pending improvement and culture results, most likely tomorrow.  Consultants:   None  Procedures:  Antimicrobials:  Ceftriaxone  Data Reviewed: I have personally reviewed following labs and imaging studies  CBC: Recent Labs  Lab 01/01/20 2025 01/02/20 0851 01/03/20 0626  WBC 21.2* 18.0* 18.3*  NEUTROABS 18.0*  --   --   HGB 12.2 10.2* 10.1*  HCT 35.5* 30.9* 31.0*  MCV 87.2 91.7 92.3  PLT 270 173 0000000   Basic Metabolic Panel: Recent Labs  Lab 01/01/20 2025 01/02/20 0851  NA 135 141  K 3.4* 4.3  CL 101 112*  CO2 20* 21*  GLUCOSE 106* 99  BUN 10 7  CREATININE 1.13* 0.66  CALCIUM 9.0 7.7*   GFR: Estimated Creatinine Clearance: 83.4 mL/min (by C-G formula based on SCr of 0.66 mg/dL). Liver Function Tests: Recent Labs  Lab 01/01/20 2025  AST 17  ALT 13  ALKPHOS 77  BILITOT 1.3*  PROT 8.7*  ALBUMIN 4.3   No results for input(s): LIPASE, AMYLASE in the last 168 hours. No results for input(s): AMMONIA in the last 168 hours. Coagulation Profile: No results for input(s): INR, PROTIME in the  last 168 hours. Cardiac Enzymes: No results for input(s): CKTOTAL, CKMB, CKMBINDEX, TROPONINI in the last 168 hours. BNP (last 3 results) No results for input(s): PROBNP in the last 8760 hours. HbA1C: No results for input(s): HGBA1C in the last 72 hours. CBG: No results for input(s): GLUCAP in the last 168 hours. Lipid Profile: No results for input(s): CHOL, HDL, LDLCALC, TRIG, CHOLHDL, LDLDIRECT in the last 72 hours. Thyroid Function Tests: No results for input(s): TSH,  T4TOTAL, FREET4, T3FREE, THYROIDAB in the last 72 hours. Anemia Panel: Recent Labs    01/03/20 0626  FERRITIN 237  TIBC 178*  IRON 13*   Sepsis Labs: Recent Labs  Lab 01/01/20 2025 01/01/20 2328  LATICACIDVEN 2.4* 0.9    Recent Results (from the past 240 hour(s))  Culture, blood (routine x 2)     Status: None (Preliminary result)   Collection Time: 01/01/20  8:25 PM   Specimen: BLOOD  Result Value Ref Range Status   Specimen Description BLOOD RIGHT ASSIST CONTROL  Final   Special Requests   Final    BOTTLES DRAWN AEROBIC AND ANAEROBIC Blood Culture adequate volume   Culture   Final    NO GROWTH 2 DAYS Performed at [facility], [address], [place], [place] [address]    Report Status PENDING  Incomplete  Urine culture     Status: Abnormal (Preliminary result)   Collection Time: 01/01/20  9:23 PM   Specimen: Urine, Random  Result Value Ref Range Status   Specimen Description   Final    URINE, RANDOM Performed at [facility], [address], [place], [place] [address]    Special Requests   Final    NONE Performed at [facility], [address], [place], [place] [address]    Culture (A)  Final    20,000 COLONIES/mL ESCHERICHIA COLI SUSCEPTIBILITIES TO FOLLOW Performed at [facility], [place] [address] [place], [place] [address]    Report Status PENDING  Incomplete  SARS CORONAVIRUS 2 (TAT 6-24 HRS) Nasopharyngeal Nasopharyngeal Swab     Status: None   Collection Time: 01/01/20 10:20 PM   Specimen: Nasopharyngeal Swab  Result Value Ref Range Status   SARS Coronavirus 2 NEGATIVE NEGATIVE Final    Comment: (NOTE) SARS-CoV-2 target nucleic acids are NOT DETECTED. The SARS-CoV-2 RNA is generally detectable in upper and lower respiratory specimens during the acute phase of infection. Negative results do not preclude SARS-CoV-2 infection, do not rule out co-infections with other pathogens, and should not be used as the sole  basis for treatment or other patient management decisions. Negative results must be combined with clinical observations, patient history, and epidemiological information. The expected result is Negative. Fact Sheet for Patients: SugarRoll.be Fact Sheet for Healthcare Providers: https://www.woods-mathews.com/ This test is not yet approved or cleared by the [place] FDA and  has been authorized for detection and/or diagnosis of SARS-CoV-2 by FDA under an Emergency Use Authorization (EUA). This EUA will remain  in effect (meaning this test can be used) for the duration of the COVID-19 declaration under Section 56 4(b)(1) of the Act, 21 U.S.C. section 360bbb-3(b)(1), unless the authorization is terminated or revoked sooner. Performed at [facility], [place] [address], [place], [place] [address]      Radiology Studies: CT Renal Stone Study  Result Date: 01/01/2020 CLINICAL DATA:  Right flank pain for multiple weeks. Recent diagnosis of pyelonephritis. EXAM: CT ABDOMEN AND PELVIS WITHOUT CONTRAST TECHNIQUE: Multidetector CT imaging of  the abdomen and pelvis was performed following the standard protocol without IV contrast. COMPARISON:  None. FINDINGS: Lower chest: The lung bases are clear. The heart size is normal. Hepatobiliary: The liver is normal. Normal gallbladder.There is no biliary ductal dilation. Pancreas: Normal contours without ductal dilatation. No peripancreatic fluid collection. Spleen: No splenic laceration or hematoma. Adrenals/Urinary Tract: --Adrenal glands: No adrenal hemorrhage. --Right kidney/ureter: Is some mild right-sided perinephric fat stranding without evidence for significant right-sided hydronephrosis. There is no evidence for right-sided radiopaque kidney stone. --Left kidney/ureter: No hydronephrosis or perinephric hematoma. --Urinary bladder: Unremarkable. Stomach/Bowel: --Stomach/Duodenum: No hiatal hernia or other  gastric abnormality. Normal duodenal course and caliber. --Small bowel: No dilatation or inflammation. --Colon: No focal abnormality. --Appendix: Not visualized. No right lower quadrant inflammation or free fluid. Vascular/Lymphatic: Normal course and caliber of the major abdominal vessels. --No retroperitoneal lymphadenopathy. --No mesenteric lymphadenopathy. --No pelvic or inguinal lymphadenopathy. Reproductive: There is an IUD in place. Other: There is a small volume of pelvic free fluid which is likely physiologic. No free air. The abdominal wall is normal. Musculoskeletal. No acute displaced fractures. IMPRESSION: 1. There is some mild right-sided perinephric fat stranding without evidence for significant right-sided hydronephrosis or radiopaque kidney stone. This could be secondary to a recently passed stone or pyelonephritis. Correlate with urinalysis. Examination was limited by lack of IV contrast. 2. Small volume of pelvic free fluid is likely physiologic or reactive. 3. IUD in place. 4. The appendix is not reliably identified. Electronically Signed   By: Constance Holster M.D.   On: 01/01/2020 22:14    Scheduled Meds: . influenza vac split quadrivalent PF  0.5 mL Intramuscular Tomorrow-1000   Continuous Infusions: . sodium chloride Stopped (01/02/20 1018)  . cefTRIAXone (ROCEPHIN)  IV 1 g (01/03/20 0857)     LOS: 1 day   Time spent: 30 minutes.  Lorella Nimrod, MD [facility] Pager [phone]  If 7PM-7AM, please contact night-coverage [email or web address] Password [facility] 01/03/2020, 2:23 PM   This record has been created using Dragon voice recognition software. Errors have been sought and corrected,but may not always be located. Such creation errors do not reflect on the standard of care.

## 2020-01-04 LAB — URINE CULTURE: Culture: 20000 — AB

## 2020-01-04 MED ORDER — FERROUS SULFATE 325 (65 FE) MG PO TABS: 325.0000 mg | ORAL_TABLET | Freq: Every day | ORAL | 3 refills | Status: AC

## 2020-01-04 MED ORDER — NAPROXEN 500 MG PO TABS: 500.0000 mg | ORAL_TABLET | Freq: Two times a day (BID) | ORAL | 2 refills | Status: AC

## 2020-01-04 MED ORDER — OXYCODONE HCL 5 MG PO TABS: 5.0000 mg | ORAL_TABLET | ORAL | 0 refills | Status: DC | PRN

## 2020-01-04 MED ORDER — FERROUS SULFATE 325 (65 FE) MG PO TABS
325.0000 mg | ORAL_TABLET | Freq: Every day | ORAL | Status: DC
  Administered 2020-01-04: 10:00:00 325 mg via ORAL
  Filled 2020-01-04: qty 1

## 2020-01-04 MED ORDER — CIPROFLOXACIN HCL 500 MG PO TABS: 500.0000 mg | ORAL_TABLET | Freq: Two times a day (BID) | ORAL | 0 refills | Status: AC

## 2020-01-04 NOTE — Progress Notes (Signed)
D/C instructions provided, pt states understanding, aware of follow up appt.  D/C home to car via CNA in wheelchair.

## 2020-01-04 NOTE — Discharge Instructions (Signed)
Pyelonephritis, Adult  Pyelonephritis is an infection that occurs in the kidney. The kidneys are organs that help clean the blood by moving waste out of the blood and into the pee (urine). This infection can happen quickly, or it can last for a long time. In most cases, it clears up with treatment and does not cause other problems. What are the causes? This condition may be caused by:  Germs (bacteria) going from the bladder up to the kidney. This may happen after having a bladder infection.  Germs going from the blood to the kidney. What increases the risk? This condition is more likely to develop in:  Pregnant women.  Older people.  People who have any of these conditions: ? Diabetes. ? Inflammation of the prostate gland (prostatitis), in males. ? Kidney stones or bladder stones. ? Other problems with the kidney or the parts of your body that carry pee from the kidneys to the bladder (ureters). ? Cancer.  People who have a small, thin tube (catheter) placed in the bladder.  People who are sexually active.  Women who use a medicine that kills sperm (spermicide) to prevent pregnancy.  People who have had a prior urinary tract infection (UTI). What are the signs or symptoms? Symptoms of this condition include:  Peeing often.  A strong urge to pee right away.  Burning or stinging when peeing.  Belly pain.  Back pain.  Pain in the side (flank area).  Fever or chills.  Blood in the pee, or dark pee.  Feeling sick to your stomach (nauseous) or throwing up (vomiting). How is this treated? This condition may be treated by:  Taking antibiotic medicines by mouth (orally).  Drinking enough fluids. If the infection is bad, you may need to stay in the hospital. You may be given antibiotics and fluids that are put directly into a vein through an IV tube. In some cases, other treatments may be needed. Follow these instructions at home: Medicines  Take your antibiotic  medicine as told by your doctor. Do not stop taking the antibiotic even if you start to feel better.  Take over-the-counter and prescription medicines only as told by your doctor. General instructions   Drink enough fluid to keep your pee pale yellow.  Avoid caffeine, tea, and carbonated drinks.  Pee (urinate) often. Avoid holding in pee for long periods of time.  Pee before and after sex.  After pooping (having a bowel movement), women should wipe from front to back. Use each tissue only once.  Keep all follow-up visits as told by your doctor. This is important. Contact a doctor if:  You do not feel better after 2 days.  Your symptoms get worse.  You have a fever. Get help right away if:  You cannot take your medicine or drink fluids as told.  You have chills and shaking.  You throw up.  You have very bad pain in your side or back.  You feel very weak or you pass out (faint). Summary  Pyelonephritis is an infection that occurs in the kidney.  In most cases, this infection clears up with treatment and does not cause other problems.  Take your antibiotic medicine as told by your doctor. Do not stop taking the antibiotic even if you start to feel better.  Drink enough fluid to keep your pee pale yellow. This information is not intended to replace advice given to you by your health care provider. Make sure you discuss any questions you have with  your health care provider. Document Revised: 10/16/2018 Document Reviewed: 10/16/2018 Elsevier Patient Education  2020 [employer] Inc.  

## 2020-01-04 NOTE — Plan of Care (Signed)
Vs stable; has been afebrile this shift; up ad lib now; tolerating regular diet; taking tylenol and oxycodone for pain control; has iv antibiotic; new iv was started this shift due to the previous IV site leaking at the hub when RN flushed it

## 2020-01-04 NOTE — Discharge Summary (Signed)
Physician Discharge Summary  Cindy Alexander [id number] DOB: June 18, 2001 DOA: 01/01/2020  PCP: Patient, No Pcp Per  Admit date: 01/01/2020 Discharge date: 01/04/2020  Admitted From: Home Disposition: Home  Recommendations for Outpatient Follow-up:  1. Follow up with PCP in 1-2 weeks 2. Please obtain BMP/CBC in one week 3. Please follow up on the following pending results: None  Home Health: No Equipment/Devices: None Discharge Condition: Stable CODE STATUS: Full Diet recommendation:  Regular   Brief/Interim Summary: [facility] a 19 y.o.femalewithoutknown significant medical history who presents to the ED for evaluation ofright flank pain. UA with leukocytosis and some bacteria.  CT abdomen concerning for pyelonephritis, no renal stones.  Urine culture grew pansensitive E. Coli. She was treated with ceftriaxone for pyelonephritis and discharged home on Cipro to complete a 7-day course. Patient was afebrile over the last 24 hours before discharge. She was also found to have iron deficiency anemia and was started on iron supplement. She continued to experience some right flank discomfort and intermittent pain.  She was given naproxen and oxycodone to use as needed.  Patient does not have any PCP so she was advised to get 1 for a regular follow-up.  Discharge Diagnoses:  Principal Problem:   Sepsis due to urinary tract infection ([facility]) Active Problems:   Hypokalemia  Discharge Instructions  Discharge Instructions    Diet - low sodium heart healthy   Complete by: As directed    Discharge instructions   Complete by: As directed    It was pleasure taking care of you. I am giving you antibiotics name ciprofloxacin to be taken twice daily for 5 days, please finish all the tablets. Please take naproxen twice daily for another 2 to 3 days and then as needed. You can also take oxycodone if needed for pain if naproxen is unable to take care of that. Keep yourself  well-hydrated. I am also giving you some iron supplement as your iron level was low. It is very important that you get a PCP to take care of your future needs.   Increase activity slowly   Complete by: As directed      Allergies as of 01/04/2020   No Known Allergies     Medication List    TAKE these medications   ciprofloxacin 500 MG tablet Commonly known as: Cipro Take 1 tablet (500 mg total) by mouth 2 (two) times daily for 5 days.   ferrous sulfate 325 (65 FE) MG tablet Take 1 tablet (325 mg total) by mouth daily with breakfast. Start taking on: January 05, 2020   naproxen 500 MG tablet Commonly known as: Naprosyn Take 1 tablet (500 mg total) by mouth 2 (two) times daily with a meal.   oxyCODONE 5 MG immediate release tablet Commonly known as: Oxy IR/ROXICODONE Take 1 tablet (5 mg total) by mouth every 4 (four) hours as needed for moderate pain or breakthrough pain (If pain not relieved by Tylenol/ibuprofen.  Hold & Call MD if SBP<90, HR<65, RR<10, O2<90, or altered mental status.).       No Known Allergies  Consultations:  None  Procedures/Studies: CT Renal Stone Study  Result Date: 01/01/2020 CLINICAL DATA:  Right flank pain for multiple weeks. Recent diagnosis of pyelonephritis. EXAM: CT ABDOMEN AND PELVIS WITHOUT CONTRAST TECHNIQUE: Multidetector CT imaging of the abdomen and pelvis was performed following the standard protocol without IV contrast. COMPARISON:  None. FINDINGS: Lower chest: The lung bases are clear. The heart size is normal. Hepatobiliary: The liver  is normal. Normal gallbladder.There is no biliary ductal dilation. Pancreas: Normal contours without ductal dilatation. No peripancreatic fluid collection. Spleen: No splenic laceration or hematoma. Adrenals/Urinary Tract: --Adrenal glands: No adrenal hemorrhage. --Right kidney/ureter: Is some mild right-sided perinephric fat stranding without evidence for significant right-sided hydronephrosis. There is no  evidence for right-sided radiopaque kidney stone. --Left kidney/ureter: No hydronephrosis or perinephric hematoma. --Urinary bladder: Unremarkable. Stomach/Bowel: --Stomach/Duodenum: No hiatal hernia or other gastric abnormality. Normal duodenal course and caliber. --Small bowel: No dilatation or inflammation. --Colon: No focal abnormality. --Appendix: Not visualized. No right lower quadrant inflammation or free fluid. Vascular/Lymphatic: Normal course and caliber of the major abdominal vessels. --No retroperitoneal lymphadenopathy. --No mesenteric lymphadenopathy. --No pelvic or inguinal lymphadenopathy. Reproductive: There is an IUD in place. Other: There is a small volume of pelvic free fluid which is likely physiologic. No free air. The abdominal wall is normal. Musculoskeletal. No acute displaced fractures. IMPRESSION: 1. There is some mild right-sided perinephric fat stranding without evidence for significant right-sided hydronephrosis or radiopaque kidney stone. This could be secondary to a recently passed stone or pyelonephritis. Correlate with urinalysis. Examination was limited by lack of IV contrast. 2. Small volume of pelvic free fluid is likely physiologic or reactive. 3. IUD in place. 4. The appendix is not reliably identified. Electronically Signed   By: Constance Holster M.D.   On: 01/01/2020 22:14    Subjective: Patient was feeling better during morning rounds today.  She continued to experience some right flank discomfort and asking for some oxycodone for home as Tylenol was not working.  She was given some oxycodone and naproxen for pain.  Discharge Exam: Vitals:   01/04/20 0323 01/04/20 0747  BP: 95/67 99/62  Pulse: 80 77  Resp: 16 16  Temp: 98.4 F (36.9 C) 98.4 F (36.9 C)  SpO2: 100% 99%   Vitals:   01/03/20 1952 01/03/20 2354 01/04/20 0323 01/04/20 0747  BP: 106/75 111/75 95/67 99/62   Pulse: 97 80 80 77  Resp: 18 16 16 16   Temp: 99.4 F (37.4 C) 98.5 F (36.9 C) 98.4  F (36.9 C) 98.4 F (36.9 C)  TempSrc: Oral Oral Oral Oral  SpO2:  100% 100% 99%  Weight:      Height:        General: Pt is alert, awake, not in acute distress Cardiovascular: RRR, S1/S2 +, no rubs, no gallops Respiratory: CTA bilaterally, no wheezing, no rhonchi Abdominal: Soft, NT, ND, bowel sounds + Extremities: no edema, no cyanosis   The results of significant diagnostics from this hospitalization (including imaging, microbiology, ancillary and laboratory) are listed below for reference.    Microbiology: Recent Results (from the past 240 hour(s))  Culture, blood (routine x 2)     Status: None (Preliminary result)   Collection Time: 01/01/20  8:25 PM   Specimen: BLOOD  Result Value Ref Range Status   Specimen Description BLOOD RIGHT ASSIST CONTROL  Final   Special Requests   Final    BOTTLES DRAWN AEROBIC AND ANAEROBIC Blood Culture adequate volume   Culture   Final    NO GROWTH 3 DAYS Performed at [facility], [address], [place], [place] [address]    Report Status PENDING  Incomplete  Urine culture     Status: Abnormal   Collection Time: 01/01/20  9:23 PM   Specimen: Urine, Random  Result Value Ref Range Status   Specimen Description   Final    URINE, RANDOM Performed at [facility], [address] [place], [place] [address]    Special Requests   Final    NONE Performed at [facility], [place], [place] [address]    Culture 20,000 COLONIES/mL ESCHERICHIA COLI (A)  Final   Report Status 01/04/2020 FINAL  Final   Organism ID, Bacteria ESCHERICHIA COLI (A)  Final      Susceptibility   Escherichia coli - MIC*    AMPICILLIN <=2 SENSITIVE Sensitive     CEFAZOLIN <=4 SENSITIVE Sensitive     CEFTRIAXONE <=0.25 SENSITIVE Sensitive     CIPROFLOXACIN <=0.25 SENSITIVE Sensitive     GENTAMICIN <=1 SENSITIVE Sensitive     IMIPENEM <=0.25 SENSITIVE Sensitive     NITROFURANTOIN <=16 SENSITIVE Sensitive      TRIMETH/SULFA <=20 SENSITIVE Sensitive     AMPICILLIN/SULBACTAM <=2 SENSITIVE Sensitive     PIP/TAZO <=4 SENSITIVE Sensitive     * 20,000 COLONIES/mL ESCHERICHIA COLI  SARS CORONAVIRUS 2 (TAT 6-24 HRS) Nasopharyngeal Nasopharyngeal Swab     Status: None   Collection Time: 01/01/20 10:20 PM   Specimen: Nasopharyngeal Swab  Result Value Ref Range Status   SARS Coronavirus 2 NEGATIVE NEGATIVE Final    Comment: (NOTE) SARS-CoV-2 target nucleic acids are NOT DETECTED. The SARS-CoV-2 RNA is generally detectable in upper and lower respiratory specimens during the acute phase of infection. Negative results do not preclude SARS-CoV-2 infection, do not rule out co-infections with other pathogens, and should not be used as the sole basis for treatment or other patient management decisions. Negative results must be combined with clinical observations, patient history, and epidemiological information. The expected result is Negative. Fact Sheet for Patients: SugarRoll.be Fact Sheet for Healthcare Providers: https://www.woods-mathews.com/ This test is not yet approved or cleared by the [place] FDA and  has been authorized for detection and/or diagnosis of SARS-CoV-2 by FDA under an Emergency Use Authorization (EUA). This EUA will remain  in effect (meaning this test can be used) for the duration of the COVID-19 declaration under Section 56 4(b)(1) of the Act, 21 U.S.C. section 360bbb-3(b)(1), unless the authorization is terminated or revoked sooner. Performed at [facility], [place] [address] [place], [place] [address]      Labs: BNP (last 3 results) No results for input(s): BNP in the last 8760 hours. Basic Metabolic Panel: Recent Labs  Lab 01/01/20 2025 01/02/20 0851  NA 135 141  K 3.4* 4.3  CL 101 112*  CO2 20* 21*  GLUCOSE 106* 99  BUN 10 7  CREATININE 1.13* 0.66  CALCIUM 9.0 7.7*   Liver Function Tests: Recent Labs  Lab  01/01/20 2025  AST 17  ALT 13  ALKPHOS 77  BILITOT 1.3*  PROT 8.7*  ALBUMIN 4.3   No results for input(s): LIPASE, AMYLASE in the last 168 hours. No results for input(s): AMMONIA in the last 168 hours. CBC: Recent Labs  Lab 01/01/20 2025 01/02/20 0851 01/03/20 0626  WBC 21.2* 18.0* 18.3*  NEUTROABS 18.0*  --   --   HGB 12.2 10.2* 10.1*  HCT 35.5* 30.9* 31.0*  MCV 87.2 91.7 92.3  PLT 270 173 175   Cardiac Enzymes: No results for input(s): CKTOTAL, CKMB, CKMBINDEX, TROPONINI in the last 168 hours. BNP: Invalid input(s): POCBNP CBG: No results for input(s): GLUCAP in the last 168 hours. D-Dimer No results for input(s): DDIMER in the last 72 hours. Hgb A1c No results for input(s): HGBA1C in the last 72 hours. Lipid Profile No results for input(s): CHOL, HDL,  LDLCALC, TRIG, CHOLHDL, LDLDIRECT in the last 72 hours. Thyroid function studies No results for input(s): TSH, T4TOTAL, T3FREE, THYROIDAB in the last 72 hours.  Invalid input(s): FREET3 Anemia work up Recent Labs    01/03/20 0626  FERRITIN 237  TIBC 178*  IRON 13*   Urinalysis    Component Value Date/Time   COLORURINE YELLOW (A) 01/01/2020 2123   APPEARANCEUR HAZY (A) 01/01/2020 2123   LABSPEC 1.011 01/01/2020 2123   PHURINE 6.0 01/01/2020 2123   GLUCOSEU NEGATIVE 01/01/2020 2123   HGBUR NEGATIVE 01/01/2020 2123   BILIRUBINUR NEGATIVE 01/01/2020 2123   KETONESUR 5 (A) 01/01/2020 2123   PROTEINUR 30 (A) 01/01/2020 2123   NITRITE NEGATIVE 01/01/2020 2123   LEUKOCYTESUR LARGE (A) 01/01/2020 2123   Sepsis Labs Invalid input(s): PROCALCITONIN,  WBC,  LACTICIDVEN Microbiology Recent Results (from the past 240 hour(s))  Culture, blood (routine x 2)     Status: None (Preliminary result)   Collection Time: 01/01/20  8:25 PM   Specimen: BLOOD  Result Value Ref Range Status   Specimen Description BLOOD RIGHT ASSIST CONTROL  Final   Special Requests   Final    BOTTLES DRAWN AEROBIC AND ANAEROBIC Blood  Culture adequate volume   Culture   Final    NO GROWTH 3 DAYS Performed at [facility], [address] [place], [place] [address]    Report Status PENDING  Incomplete  Urine culture     Status: Abnormal   Collection Time: 01/01/20  9:23 PM   Specimen: Urine, Random  Result Value Ref Range Status   Specimen Description   Final    URINE, RANDOM Performed at [facility], [address] [place], [place] [address]    Special Requests   Final    NONE Performed at [facility], [place], [place] [address]    Culture 20,000 COLONIES/mL ESCHERICHIA COLI (A)  Final   Report Status 01/04/2020 FINAL  Final   Organism ID, Bacteria ESCHERICHIA COLI (A)  Final      Susceptibility   Escherichia coli - MIC*    AMPICILLIN <=2 SENSITIVE Sensitive     CEFAZOLIN <=4 SENSITIVE Sensitive     CEFTRIAXONE <=0.25 SENSITIVE Sensitive     CIPROFLOXACIN <=0.25 SENSITIVE Sensitive     GENTAMICIN <=1 SENSITIVE Sensitive     IMIPENEM <=0.25 SENSITIVE Sensitive     NITROFURANTOIN <=16 SENSITIVE Sensitive     TRIMETH/SULFA <=20 SENSITIVE Sensitive     AMPICILLIN/SULBACTAM <=2 SENSITIVE Sensitive     PIP/TAZO <=4 SENSITIVE Sensitive     * 20,000 COLONIES/mL ESCHERICHIA COLI  SARS CORONAVIRUS 2 (TAT 6-24 HRS) Nasopharyngeal Nasopharyngeal Swab     Status: None   Collection Time: 01/01/20 10:20 PM   Specimen: Nasopharyngeal Swab  Result Value Ref Range Status   SARS Coronavirus 2 NEGATIVE NEGATIVE Final    Comment: (NOTE) SARS-CoV-2 target nucleic acids are NOT DETECTED. The SARS-CoV-2 RNA is generally detectable in upper and lower respiratory specimens during the acute phase of infection. Negative results do not preclude SARS-CoV-2 infection, do not rule out co-infections with other pathogens, and should not be used as the sole basis for treatment or other patient management decisions. Negative results must be combined with clinical observations, patient  history, and epidemiological information. The expected result is Negative. Fact Sheet for Patients: SugarRoll.be Fact Sheet for Healthcare Providers: https://www.woods-mathews.com/ This test is not yet approved or cleared by the [place] FDA and  has been authorized for detection  and/or diagnosis of SARS-CoV-2 by FDA under an Emergency Use Authorization (EUA). This EUA will remain  in effect (meaning this test can be used) for the duration of the COVID-19 declaration under Section 56 4(b)(1) of the Act, 21 U.S.C. section 360bbb-3(b)(1), unless the authorization is terminated or revoked sooner. Performed at [facility], [place] [address] [place], [place] [address]     Time coordinating discharge: Over 30 minutes  SIGNED:  Lorella Nimrod, MD  [facility] 01/04/2020, 11:17 AM Pager [phone]  If 7PM-7AM, please contact night-coverage [email or web address] Password [email or web address]  This record has been created using Systems analyst. Errors have been sought and corrected,but may not always be located. Such creation errors do not reflect on the standard of care.

## 2020-01-06 LAB — CULTURE, BLOOD (ROUTINE X 2)
Culture: NO GROWTH
Special Requests: ADEQUATE

## 2020-01-13 ENCOUNTER — Ambulatory Visit: Payer: BC Managed Care – PPO | Admitting: Family Medicine

## 2020-01-13 ENCOUNTER — Encounter: Payer: Self-pay | Admitting: Family Medicine

## 2020-01-13 ENCOUNTER — Other Ambulatory Visit: Payer: Self-pay

## 2020-01-13 VITALS — BP 95/64 | HR 74 | Temp 98.9°F | Ht 65.0 in | Wt 103.0 lb

## 2020-01-13 DIAGNOSIS — D473 Essential (hemorrhagic) thrombocythemia: Secondary | ICD-10-CM

## 2020-01-13 DIAGNOSIS — D75839 Thrombocytosis, unspecified: Secondary | ICD-10-CM

## 2020-01-13 DIAGNOSIS — Z87448 Personal history of other diseases of urinary system: Secondary | ICD-10-CM | POA: Diagnosis not present

## 2020-01-13 DIAGNOSIS — E876 Hypokalemia: Secondary | ICD-10-CM

## 2020-01-13 LAB — POCT URINALYSIS DIP (MANUAL ENTRY)
Bilirubin, UA: NEGATIVE
Blood, UA: NEGATIVE
Glucose, UA: NEGATIVE mg/dL
Ketones, POC UA: NEGATIVE mg/dL
Leukocytes, UA: NEGATIVE
Nitrite, UA: NEGATIVE
Protein Ur, POC: NEGATIVE mg/dL
Spec Grav, UA: 1.025 (ref 1.010–1.025)
Urobilinogen, UA: 0.2 E.U./dL
pH, UA: 7 (ref 5.0–8.0)

## 2020-01-13 NOTE — Progress Notes (Signed)
1/18/20213:14 PM  Cindy Alexander [birth year], 19 y.o., female [id number]  Chief Complaint  Patient presents with  . Follow-up    had kidney infection, feels progressively better. Wants to know how to avoid going through this in the future    HPI:   Patient is a 19 y.o. 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 and getting another infection  Lab Results  Component Value Date   CREATININE 0.66 01/02/2020   BUN 7 01/02/2020   NA 141 01/02/2020   K 4.3 01/02/2020   CL 112 (H) 01/02/2020   CO2 21 (L) 01/02/2020   Lab Results  Component Value Date   WBC 18.3 (H) 01/03/2020   HGB 10.1 (L) 01/03/2020   HCT 31.0 (L) 01/03/2020   MCV 92.3 01/03/2020   PLT 175 01/03/2020   Lab Results  Component Value Date   IRON 13 (L) 01/03/2020   TIBC 178 (L) 01/03/2020   FERRITIN 237 01/03/2020    Depression screen PHQ 2/9 01/13/2020  Decreased Interest 0  Down, Depressed, Hopeless 0  PHQ - 2 Score 0    Fall Risk  01/13/2020 01/13/2020  Falls in the past year? 0 0  Number falls in past yr: 0 0  Injury with Fall? 0 0     No Known Allergies  Prior to Admission medications   Medication Sig Start Date End Date Taking? Authorizing Provider  ferrous sulfate 325 (65 FE) MG tablet Take 1 tablet (325 mg total) by mouth daily with breakfast. 01/05/20  Yes Lorella Nimrod, MD  naproxen (NAPROSYN) 500 MG tablet Take 1 tablet (500 mg total) by mouth 2 (two) times daily with a meal. Patient not taking: Reported on 01/13/2020 01/04/20 01/03/21  Lorella Nimrod, MD  oxyCODONE (OXY IR/ROXICODONE) 5 MG immediate release tablet Take 1  tablet (5 mg total) by mouth every 4 (four) hours as needed for moderate pain or breakthrough pain (If pain not relieved by Tylenol/ibuprofen.  Hold & Call MD if SBP<90, HR<65, RR<10, O2<90, or altered mental status.). Patient not taking: Reported on 01/13/2020 01/04/20   Lorella Nimrod, MD    History reviewed. No pertinent past medical history.  History reviewed. No pertinent surgical history.  Social History   Tobacco Use  . Smoking status: Light Tobacco Smoker  . Smokeless tobacco: Never Used  . Tobacco comment: "max 4 cigarettes per month"  Substance Use Topics  . Alcohol use: Yes    Comment: occasional    Family History  Problem Relation Age of Onset  . Epilepsy Brother     Review of Systems  Constitutional: Negative for chills, diaphoresis, fever and malaise/fatigue.  Gastrointestinal: Negative for abdominal pain, nausea and vomiting.  Genitourinary: Negative for dysuria, flank pain, frequency, hematuria and urgency.     OBJECTIVE:  Today's Vitals   01/13/20 1447  BP: 95/64  Pulse: 74  Temp: 98.9 F (37.2 C)  SpO2: 100%  Weight: 103 lb (46.7 kg)  Height: 5\' 5"  (1.651 m)   Body mass index is 17.14 kg/m.   Physical Exam Vitals and  nursing note reviewed.  Constitutional:      Appearance: She is well-developed.  HENT:     Head: Normocephalic and atraumatic.     Mouth/Throat:     Pharynx: No oropharyngeal exudate.  Eyes:     General: No scleral icterus.    Conjunctiva/sclera: Conjunctivae normal.     Pupils: Pupils are equal, round, and reactive to light.  Cardiovascular:     Rate and Rhythm: Normal rate and regular rhythm.     Heart sounds: Normal heart sounds. No murmur. No friction rub. No gallop.   Pulmonary:     Effort: Pulmonary effort is normal.     Breath sounds: Normal breath sounds. No wheezing, rhonchi or rales.  Abdominal:     Tenderness: There is no right CVA tenderness or left CVA tenderness.  Musculoskeletal:     Cervical back: Neck  supple.  Lymphadenopathy:     Cervical: No cervical adenopathy.  Skin:    General: Skin is warm and dry.  Neurological:     Mental Status: She is alert and oriented to person, place, and time.     Results for orders placed or performed in visit on 01/13/20 (from the past 24 hour(s))  POCT urinalysis dipstick     Status: None   Collection Time: 01/13/20  3:42 PM  Result Value Ref Range   Color, UA yellow yellow   Clarity, UA clear clear   Glucose, UA negative negative mg/dL   Bilirubin, UA negative negative   Ketones, POC UA negative negative mg/dL   Spec Grav, UA 1.025 1.010 - 1.025   Blood, UA negative negative   pH, UA 7.0 5.0 - 8.0   Protein Ur, POC negative negative mg/dL   Urobilinogen, UA 0.2 0.2 or 1.0 E.U./dL   Nitrite, UA Negative Negative   Leukocytes, UA Negative Negative    No results found.   ASSESSMENT and PLAN  1. H/O pyelonephritis Recovered well. UA today normal. Discussed importance of good hydration, urinating after sex and RTC precautions - POCT urinalysis dipstick - CBC - Basic Metabolic Panel  2. Hypokalemia Checking labs today  Return if symptoms worsen or fail to improve.    Rutherford Guys, MD Primary Care at [place] [place], [place] [address] Ph.  [phone] Fax [phone]

## 2020-01-14 LAB — CBC
Hematocrit: 36.1 % (ref 34.0–46.6)
Hemoglobin: 12.1 g/dL (ref 11.1–15.9)
MCH: 29.9 pg (ref 26.6–33.0)
MCHC: 33.5 g/dL (ref 31.5–35.7)
MCV: 89 fL (ref 79–97)
Platelets: 503 10*3/uL — ABNORMAL HIGH (ref 150–450)
RBC: 4.05 x10E6/uL (ref 3.77–5.28)
RDW: 11.9 % (ref 11.7–15.4)
WBC: 7 10*3/uL (ref 3.4–10.8)

## 2020-01-14 LAB — BASIC METABOLIC PANEL
BUN/Creatinine Ratio: 18 (ref 9–23)
BUN: 17 mg/dL (ref 6–20)
CO2: 24 mmol/L (ref 20–29)
Calcium: 9.2 mg/dL (ref 8.7–10.2)
Chloride: 104 mmol/L (ref 96–106)
Creatinine, Ser: 0.97 mg/dL (ref 0.57–1.00)
GFR calc Af Amer: 98 mL/min/{1.73_m2} (ref >59)
GFR calc non Af Amer: 85 mL/min/{1.73_m2} (ref >59)
Glucose: 71 mg/dL (ref 65–99)
Potassium: 4.7 mmol/L (ref 3.5–5.2)
Sodium: 141 mmol/L (ref 134–144)

## 2020-01-24 NOTE — Addendum Note (Signed)
Addended by: Rutherford Guys on: 01/24/2020 12:33 PM   Modules accepted: Orders

## 2020-01-31 ENCOUNTER — Ambulatory Visit (INDEPENDENT_AMBULATORY_CARE_PROVIDER_SITE_OTHER): Payer: BC Managed Care – PPO | Admitting: Family Medicine

## 2020-01-31 ENCOUNTER — Other Ambulatory Visit: Payer: Self-pay

## 2020-01-31 DIAGNOSIS — D473 Essential (hemorrhagic) thrombocythemia: Secondary | ICD-10-CM

## 2020-01-31 DIAGNOSIS — D75839 Thrombocytosis, unspecified: Secondary | ICD-10-CM

## 2020-01-31 LAB — CBC WITH DIFFERENTIAL/PLATELET
Basophils Absolute: 0 10*3/uL (ref 0.0–0.2)
Basos: 1 %
EOS (ABSOLUTE): 0.1 10*3/uL (ref 0.0–0.4)
Eos: 1 %
Hematocrit: 35.5 % (ref 34.0–46.6)
Hemoglobin: 12.3 g/dL (ref 11.1–15.9)
Immature Grans (Abs): 0 10*3/uL (ref 0.0–0.1)
Immature Granulocytes: 0 %
Lymphocytes Absolute: 1.9 10*3/uL (ref 0.7–3.1)
Lymphs: 29 %
MCH: 30.8 pg (ref 26.6–33.0)
MCHC: 34.6 g/dL (ref 31.5–35.7)
MCV: 89 fL (ref 79–97)
Monocytes Absolute: 0.5 10*3/uL (ref 0.1–0.9)
Monocytes: 7 %
Neutrophils Absolute: 4.1 10*3/uL (ref 1.4–7.0)
Neutrophils: 62 %
Platelets: 216 10*3/uL (ref 150–450)
RBC: 4 x10E6/uL (ref 3.77–5.28)
RDW: 13.2 % (ref 11.7–15.4)
WBC: 6.6 10*3/uL (ref 3.4–10.8)

## 2021-02-09 IMAGING — CT CT RENAL STONE PROTOCOL
3 of 4 series · 8 of 46 positions shown, 15 images · non-contrast
Comparison: None.

CLINICAL DATA: Right flank pain for multiple weeks. Recent
diagnosis of pyelonephritis.

EXAM:
CT ABDOMEN AND PELVIS WITHOUT CONTRAST
TECHNIQUE: Multidetector CT imaging of the abdomen and pelvis was performed
following the standard protocol without IV contrast.

[Series 4: lung bases · axial · 0.62mm/px · z∈[-492,-442]mm · 4 of 18 slices shown, 9 images]
[im 4/18  soft-tissue]
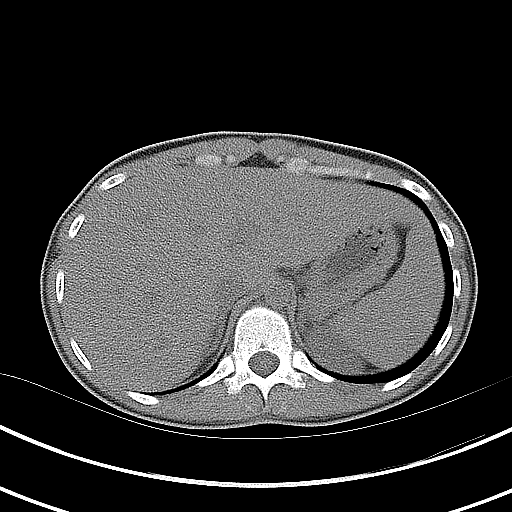
[im 4/18  lung]
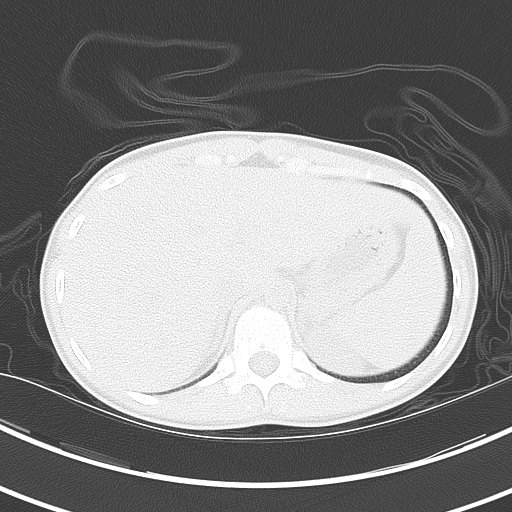
[im 4/18  bone]
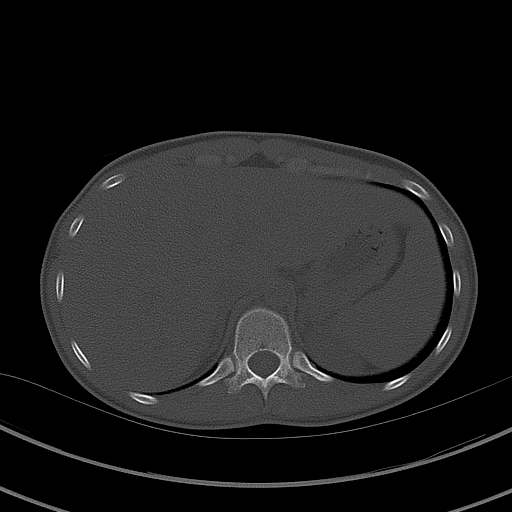
[im 7/18  soft-tissue]
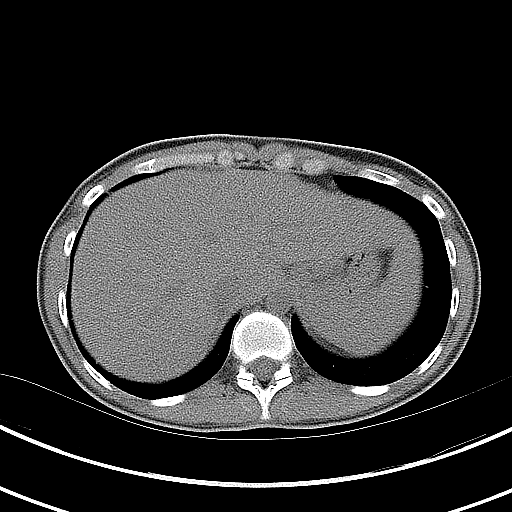
[im 7/18  lung]
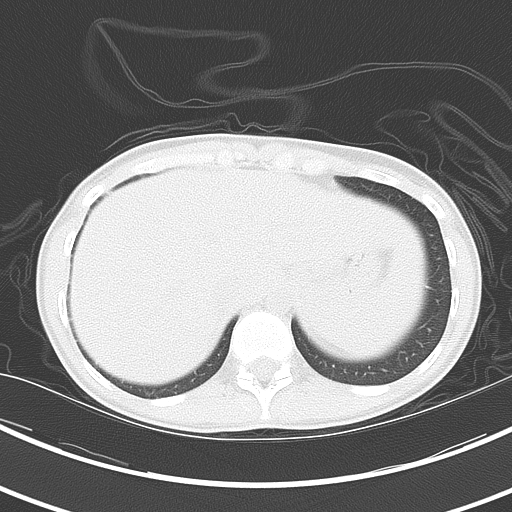
[im 11/18  soft-tissue]
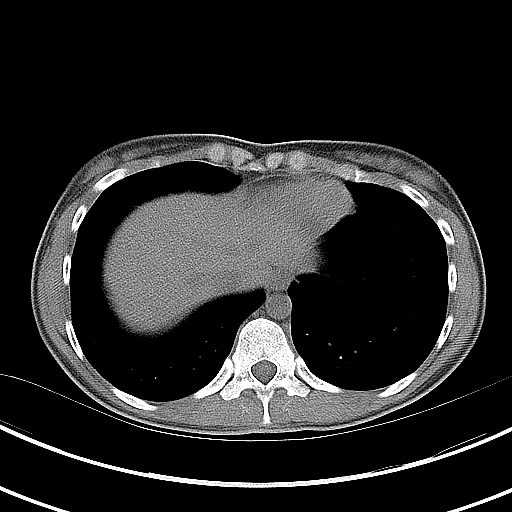
[im 11/18  lung]
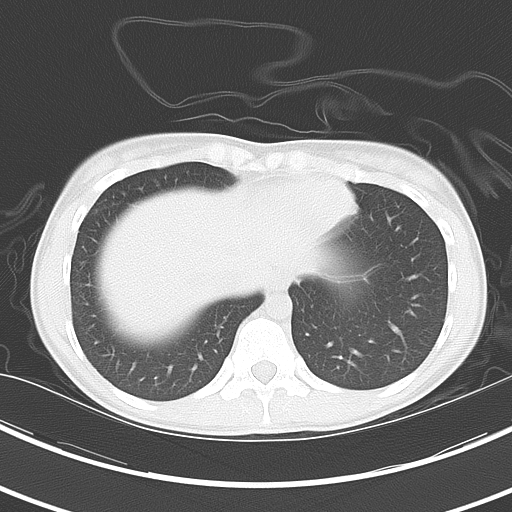
[im 14/18  soft-tissue]
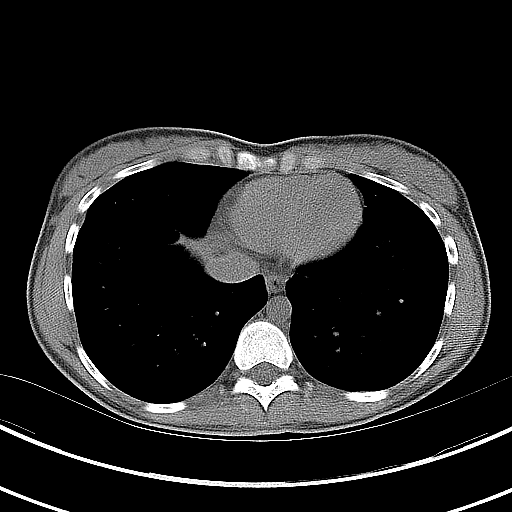
[im 14/18  lung]
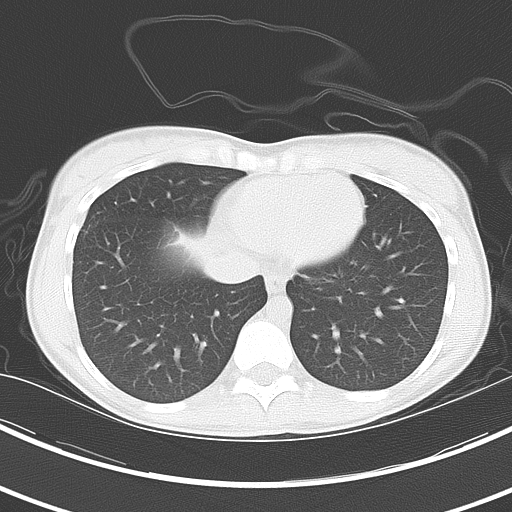

[Series 5: coronal · coronal · 0.66mm/px · 3 of 97 slices shown, 4 images]
[im 33/97  soft-tissue]
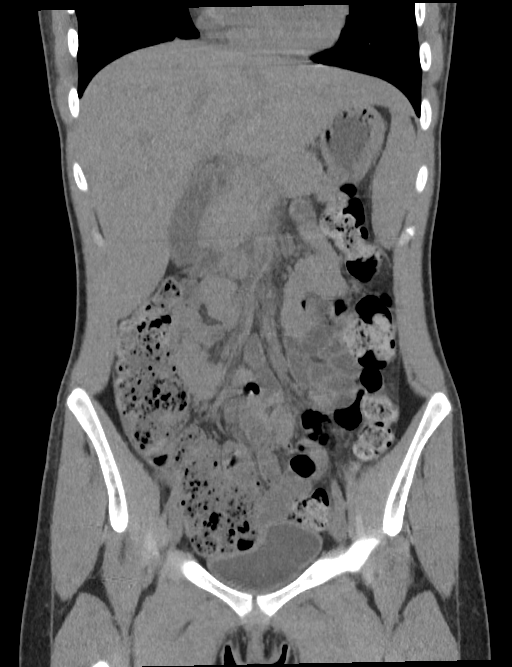
[im 43/97  soft-tissue]
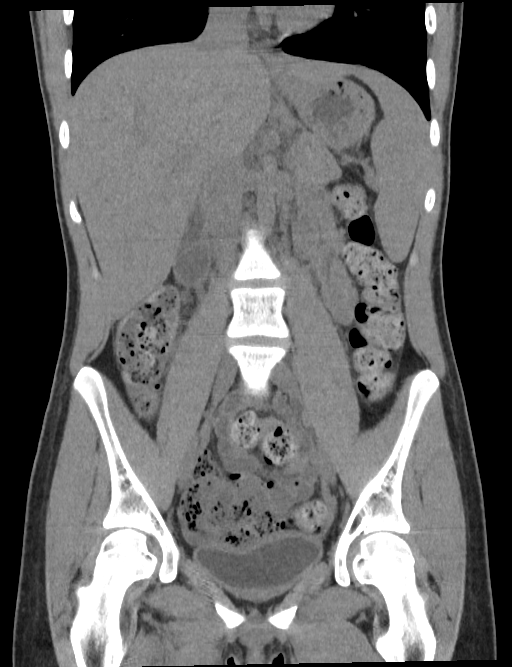
[im 43/97  bone]
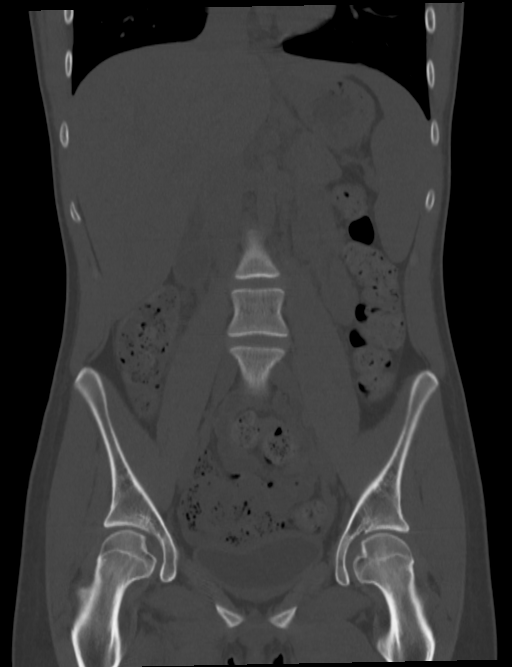
[im 54/97  soft-tissue]
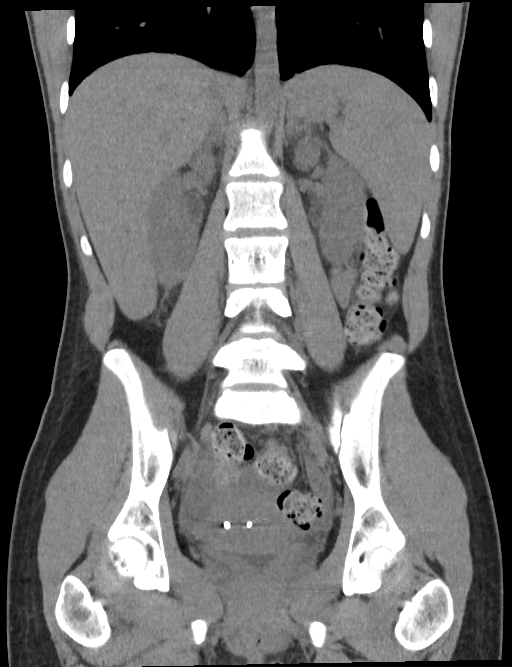

[Series 6: sagittal · sagittal · 0.40mm/px · 1 of 137 slices shown, 2 images]
[im 46/137  soft-tissue]
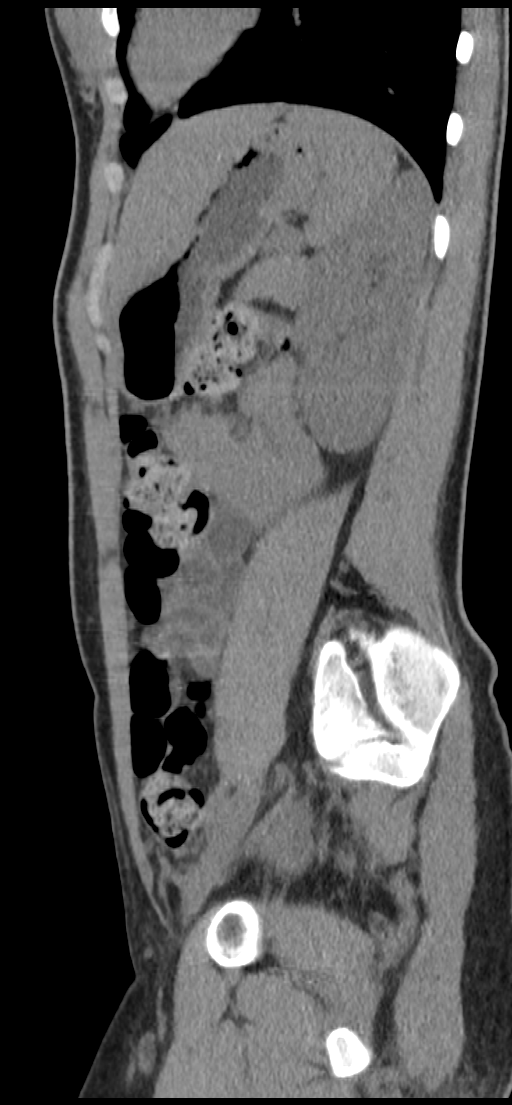
[im 46/137  bone]
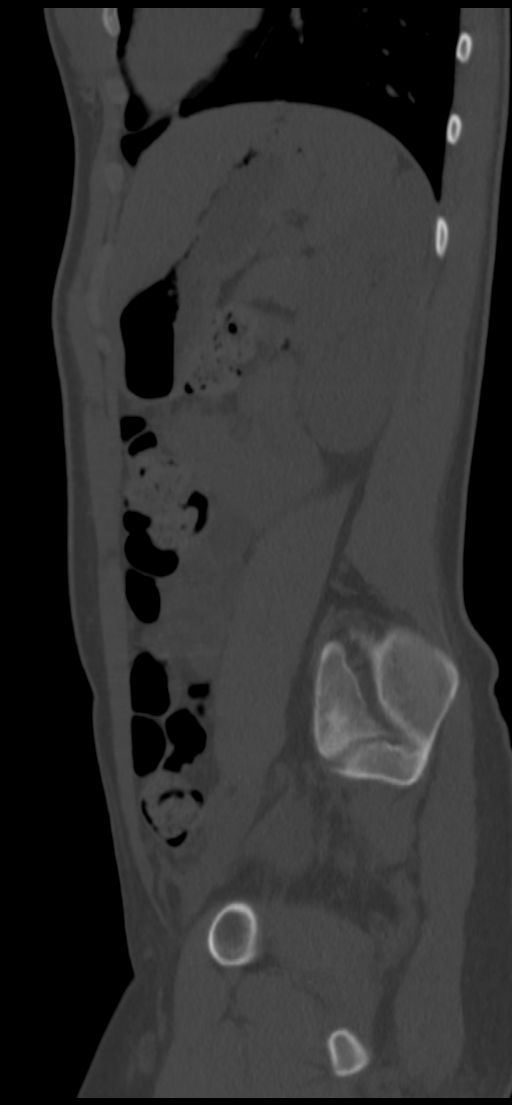

[8 of 46 positions shown; findings below may reference images not displayed]

FINDINGS: Lower chest: The lung bases are clear. The heart size is normal.

Hepatobiliary: The liver is normal. Normal gallbladder.There is no
biliary ductal dilation.

Pancreas: Normal contours without ductal dilatation. No
peripancreatic fluid collection.

Spleen: No splenic laceration or hematoma.

Adrenals/Urinary Tract:

--Adrenal glands: No adrenal hemorrhage.

--Right kidney/ureter: Is some mild right-sided perinephric fat
stranding without evidence for significant right-sided
hydronephrosis. There is no evidence for right-sided radiopaque
kidney stone.

--Left kidney/ureter: No hydronephrosis or perinephric hematoma.

--Urinary bladder: Unremarkable.

Stomach/Bowel:

--Stomach/Duodenum: No hiatal hernia or other gastric abnormality.
Normal duodenal course and caliber.

--Small bowel: No dilatation or inflammation.

--Colon: No focal abnormality.

--Appendix: Not visualized. No right lower quadrant inflammation or
free fluid.

Vascular/Lymphatic: Normal course and caliber of the major abdominal
vessels.

--No retroperitoneal lymphadenopathy.

--No mesenteric lymphadenopathy.

--No pelvic or inguinal lymphadenopathy.

Reproductive: There is an IUD in place.

Other: There is a small volume of pelvic free fluid which is likely
physiologic. No free air. The abdominal wall is normal.

Musculoskeletal. No acute displaced fractures.
IMPRESSION: 1. There is some mild right-sided perinephric fat stranding without
evidence for significant right-sided hydronephrosis or radiopaque
kidney stone. This could be secondary to a recently passed stone or
pyelonephritis. Correlate with urinalysis. Examination was limited
by lack of IV contrast.
2. Small volume of pelvic free fluid is likely physiologic or
reactive.
3. IUD in place.
4. The appendix is not reliably identified.

## 2021-11-21 ENCOUNTER — Emergency Department
Admission: EM | Admit: 2021-11-21 | Discharge: 2021-11-21 | Disposition: A | Discharge status: Home / Self Care | Payer: No Typology Code available for payment source | Attending: Emergency Medicine | Admitting: Emergency Medicine

## 2021-11-21 ENCOUNTER — Other Ambulatory Visit: Payer: Self-pay

## 2021-11-21 ENCOUNTER — Emergency Department: Payer: No Typology Code available for payment source

## 2021-11-21 DIAGNOSIS — M542 Cervicalgia: Secondary | ICD-10-CM | POA: Diagnosis present

## 2021-11-21 DIAGNOSIS — Y92410 Unspecified street and highway as the place of occurrence of the external cause: Secondary | ICD-10-CM | POA: Insufficient documentation

## 2021-11-21 DIAGNOSIS — F17200 Nicotine dependence, unspecified, uncomplicated: Secondary | ICD-10-CM | POA: Insufficient documentation

## 2021-11-21 DIAGNOSIS — R519 Headache, unspecified: Secondary | ICD-10-CM | POA: Diagnosis not present

## 2021-11-21 LAB — POC URINE PREG, ED: Preg Test, Ur: NEGATIVE

## 2021-11-21 MED ORDER — METOCLOPRAMIDE HCL 10 MG PO TABS
10.0000 mg | ORAL_TABLET | Freq: Once | ORAL | Status: AC
  Administered 2021-11-21: 17:00:00 10 mg via ORAL
  Filled 2021-11-21: qty 1

## 2021-11-21 MED ORDER — ACETAMINOPHEN 325 MG PO TABS
650.0000 mg | ORAL_TABLET | Freq: Once | ORAL | Status: AC
  Administered 2021-11-21: 17:00:00 650 mg via ORAL
  Filled 2021-11-21: qty 2

## 2021-11-21 MED ORDER — CYCLOBENZAPRINE HCL 5 MG PO TABS: 5.0000 mg | ORAL_TABLET | Freq: Three times a day (TID) | ORAL | 0 refills | Status: AC | PRN

## 2021-11-21 MED ORDER — METOCLOPRAMIDE HCL 10 MG PO TABS: 10.0000 mg | ORAL_TABLET | Freq: Three times a day (TID) | ORAL | 0 refills | Status: AC
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
## 2021-11-21 NOTE — ED Notes (Signed)
Dc ppw provided. Pt assisted off unit post verbal consent for dc. Pt followup and rx information given. Pt assisted off unit on foot
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
## 2023-10-18 DIAGNOSIS — R55 Syncope and collapse: Secondary | ICD-10-CM | POA: Insufficient documentation

## 2023-10-31 ENCOUNTER — Other Ambulatory Visit (HOSPITAL_COMMUNITY): Payer: Self-pay | Admitting: Neurology

## 2023-10-31 DIAGNOSIS — R569 Unspecified convulsions: Secondary | ICD-10-CM

## 2023-11-02 ENCOUNTER — Ambulatory Visit (HOSPITAL_COMMUNITY)
Admission: RE | Admit: 2023-11-02 | Discharge: 2023-11-02 | Disposition: A | Discharge status: Home / Self Care | Source: Ambulatory Visit | Attending: Neurology | Admitting: Neurology

## 2023-11-02 DIAGNOSIS — R569 Unspecified convulsions: Secondary | ICD-10-CM | POA: Insufficient documentation

## 2024-02-27 DIAGNOSIS — G40909 Epilepsy, unspecified, not intractable, without status epilepticus: Secondary | ICD-10-CM | POA: Insufficient documentation

## 2024-05-07 ENCOUNTER — Ambulatory Visit (INDEPENDENT_AMBULATORY_CARE_PROVIDER_SITE_OTHER): Payer: PRIVATE HEALTH INSURANCE | Admitting: Medical Genetics

## 2024-05-07 DIAGNOSIS — F840 Autistic disorder: Secondary | ICD-10-CM | POA: Diagnosis not present

## 2024-05-07 DIAGNOSIS — R55 Syncope and collapse: Secondary | ICD-10-CM | POA: Diagnosis not present

## 2024-05-07 DIAGNOSIS — G40509 Epileptic seizures related to external causes, not intractable, without status epilepticus: Secondary | ICD-10-CM | POA: Diagnosis not present

## 2024-05-07 DIAGNOSIS — Z3490 Encounter for supervision of normal pregnancy, unspecified, unspecified trimester: Secondary | ICD-10-CM

## 2024-05-07 NOTE — Progress Notes (Unsigned)
 MEDICAL GENETICS NEW PATIENT EVALUATION  Patient name: Cindy Alexander DOB: 2001/05/03 Age: 23 y.o. MRN: [id number]  Referring Provider/Specialty: Marlyce Sine, MD  Date of Evaluation: 05/07/2024 Chief Complaint/Reason for Referral: Seizure disorder  Assessment: We discussed with [name] that there could be a genetic cause to her various medical and developmental/behavioral health symptoms. She also has a family history of a brother with similar symptoms but more significant compared to Ms. Wellsite geologist. Appropriate testing at this time would include exome sequencing; this would simultaneously evaluate thousands of individual genes for smaller changes, as well as the chromosomes for gains or losses of genetic material. [name] was interested in this being performed, and consent and samples were obtained for a trio exome sequencing study through [facility]. The results are expected in 1-2 months, and we will contact her when they are available.   Recommendations: Trio exome sequencing through [employer] - results expected in 1-2 months. Continue follow up with current medical providers per their recommendations.  Follow up will be based on the results of the testing.   HPI: Cindy Alexander is a 23 y.o. 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 She provided the history. This information, along with a review of pertinent records, labs, and radiology studies, is summarized below.  At the age of 1, Ms. Gioffre experienced dizziness and presyncopal episodes. This was thought to be vasovagal in etiology. She saw a neurologist around age 32 and was diagnosed with hyperventilation syndrome. She developed seizure-like activity (fainting episode with hands going numb) at age 58 while on active duty in [place]. An EEG showed rare left temporal sharp wave discharges. Her last seizure event was in 11/2023. 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 A tilt table test in 11/2023 was normal. She is on medication (oxcarbazepine) for her seizures. She is followed by neurology in [place], who referred her to genetics for further evaluation.  Ms. Quintero was treated for ADHD symptoms in HS, but more recently (2024) had an autism evaluation and was diagnosed with level 1 ASD. Some of her symptoms include social difficulties, attention seeking, and sensory concerns.She also chews her nails and picks at her skin. She is also literal but has some flexible thinking that has improved over time. She also has some motor and vocal tics that are exacerbated by caffeine.  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 Her partner is also an SMA carrier. She plans to have the fetus evaluated for SMA.  Past Medical History: Patient Active Problem List   Diagnosis Date Noted   Autism 05/07/2024   Currently pregnant 05/07/2024   Epilepsy (HCC) 02/27/2024   Syncope and collapse 10/18/2023   Chronic hyperventilation syndrome 04/02/2015   Dizziness 04/02/2015   Extremity numbness 04/02/2015   Developmental History: Milestones -- No concerns with developmental milestones; ADHD symptoms in HS and was on medication but stopped prior to joining the [employer] Therapies -- None School -- Graduated and did some college, did well in school, had an IEP  Medications: Current Outpatient Medications on File Prior to Visit  Medication Sig Dispense Refill   cyclobenzaprine  (FLEXERIL ) 5 MG tablet Take 1 tablet (5 mg total) by mouth 3 (three) times daily as needed. 15 tablet 0   ferrous sulfate  325 (65 FE) MG tablet Take 1 tablet (325 mg total) by mouth daily with breakfast.  90 tablet 3   metoCLOPramide  (REGLAN ) 10 MG tablet Take 1 tablet (10 mg total) by mouth 3 (three) times daily with meals for 10 days. 30 tablet 0   No current facility-administered medications on file prior to visit.   Allergies:   No Known Allergies  Review of Systems: Negative except as noted in the HPI  Family History: The family history was notable for the following: Brother, 3 yo, with epilepsy with onset at 24 yo, growth hormone deficiency, suspected autism spectrum disorder level 2, learning disability, intellectual disability, and unknown genetic testing status.   Paternal Family History Father, 68 yo, with colon cancer diagnosed at 24 yo with multiple polyps, hypertension, and ADHD. Aunt, 49 yo, with colon cancer diagnosed at 24 yo and peripheral neuropathy. Uncle, 39 yo, alive and well. Grandfather, deceased at 25 yo, with peripheral neuropathy, bipolar disorder, and cardiac arrest. Grandmother, deceased at 34 yo, with Lyme disease and pneumonia.   Maternal Family History Mother, 42 yo, with myodesopsias. Uncle, 69 yo, with infertility. Grandfather, alive and well. Grandmother, 49 yo, with osteoporosis and arthritis.   Mother's ethnicity: [place] Father's ethnicity: [place], [place] Consangunity: Denies Please see the Dentist note for additional information  Social History: Lives with spouse in [place]  Vitals: Weight: 112.6 lb  Height: 5'5"  Head circumference: 54.9 cm   Genetics Physical Exam:  Constitution: The patient is active and alert  Head: No abnormalities detected in: head, hairline, shape or size    Anterior fontanelle flat: not flat    Anterior fontanelle open: not open    Bitemporal narrowing: forehead not narrow    Frontal bossing: no frontal bossing    Macrocephaly: not macrocephalic    Microcephaly: not microcephalic    Plagiocephaly: not plagiocephalic  Face: No abnormalities detected in: face, midface or shape    Coarse facial features: no coarse facies    Midfacial hypoplasia: no midfacial hypoplasia  Eyes: No abnormalities detected in: eyes, eyebrows, irises, eyelashes, lids or pupils    Deep-set eyes: eyes not deep set    Downslanting palpebral  fissure: no downslanting palpebral fissure    Epicanthus: no epicanthus inversus    Upslanting palpebral fissure: no upslanting palpebral fissure  Ears: No abnormalities detected in: ears    Low-set ears: ears not low set    Posteriorly rotated ears: ears not posteriorly rotated  Nose: No abnormalities detected in: nose, nasal bridge or nasal tip    Bulbous nasal tip: no prominent nasal tip    Columella below nares: no columella below nares    Depressed nasal bridge: no depressed nasal bridge    Flat nasal bridge: no flat nasal bridge    Hypoplastic alae nasi: nasal alae not underdeveloped     Upturned nasal tip: non-upturned nasal tip  Mouth: No abnormalities detected in: mouth, palate, lips, teeth or tongue    High-arched palate: palate not high arched    Micrognathia: no micrognathia    Smooth philtrum: non-smooth philtrum    Thin upper lip vermilion: non-thin upper lip vermilion Teeth:    Abnormal shape: normal morphology     Discolored: normal color     Misaligned: no misalignment of teeth   Neck: No abnormalities detected in: neck    Cysts: no cysts    Pits: no pits in neck    Redundant nuchal skin: no redundant neck skin    Webbing: no webbed neck  Cardiac: No abnormalities detected in: cardiovascular system    Abnormal distal perfusion:  normal distal perfusion    Irregular rate: heart rate regular    Irregular rhythm: regular rhythm    Murmur: no murmur  Lungs: No abnormalities detected in: pulmonary system, bilateral auscultation or effort  Abdomen: No abnormalities detected in: abdomen or appearance    Abnormal umbilicus: normal umbilicus    Diastasis recti: no diastasis recti    Distended abdomen: no distension    Hepatosplenomegaly: no hepatosplenomegaly    Umbilical hernia: no umbilical hernia  Spine: No abnormalities detected in: spine    Sacral anomalies: sacrum normal    Scoliosis: no scoliosis    Sacral dimple: no sacral  dimple  Neurological: No abnormalities detected in: neurological system, deep tendon reflexes, antigravity movement of extremities, strength, facial movement or tone    Hypertonia: not hypertonic    Hypotonia: not hypotonic  Genitourinary: not assessed  Hair, Nails, and Skin: No abnormalities detected in: integumentary system, hair, nails or skin    Abnormally healed scars: no abnormally healed scars    Birthmarks: no birthmarks    Lesions: no lesions  Extremities: (comments: Increased flexibility of fingers, wrist, and knees)  Hands and Feet: No abnormalities detected in: distal extremities    Clinodactyly: no clinodactyly    Polydactyly: no polydactyly    Single palmar crease: multiple palmar creases    Syndactyly: no syndactyly   [place] Haldeman-Englert, MD [facility] Date: 05/07/2024 Time: 1600   Total time spent: 80 minutes Time spent includes face to face and non-face to face care for the patient on the date of this encounter (history and physical, genetic counseling, coordination of care, data gathering and/or documentation as outlined).  Genetic counselor: Philbert Brave, MS, [facility]

## 2024-05-07 NOTE — Progress Notes (Unsigned)
 GENETIC COUNSELING NEW PATIENT EVALUATION Patient name: Cindy Alexander DOB: August 06, 2001 Age: 23 y.o. MRN: [id number]  Referring Provider/Specialty: Marlyce Sine, MD  Date of Evaluation: 05/07/2024 Chief Complaint/Reason for Referral: Seizure disorder   Brief Summary: Cindy Alexander is a 23 y.o. female who presents today for an initial genetics evaluation for a seizure disorder. She is unaccompanied at today's visit.  Prior genetic testing has been performed. 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   Family History: See pedigree obtained during today's visit under History->Family->Pedigree.  The family history was notable for the following: Brother, 21 yo, with epilepsy with onset at 24 yo, growth hormone deficiency, suspected autism spectrum disorder level 2, learning disability, intellectual disability, and unknown genetic testing status.  Paternal Family History Father, 89 yo, with colon cancer diagnosed at 24 yo with multiple polyps, hypertension, and ADHD. Aunt, 61 yo, with colon cancer diagnosed at 24 yo and peripheral neuropathy. Uncle, 66 yo, alive and well. Grandfather, deceased at 26 yo, with peripheral neuropathy, bipolar disorder, and cardiac arrest. Grandmother, deceased at 76 yo, with Lyme disease and pneumonia.  Maternal Family History Mother, 47 yo, with myodesopsias. Uncle, 26 yo, with infertility. Grandfather, alive and well. Grandmother, 24 yo, with osteoporosis and arthritis.  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  Genetic Counseling: Cindy Alexander is  a 23 y.o. female with a seizure disorder and autism spectrum disorder.  For detailed HPI, please see accompanying note from Dr. [place] Haldeman Englert.  There is a family history of epilepsy, autism, learning disability, and intellectual disability in Cindy Alexander's brother, 10 yo.  She is unsure if genetic testing has been completed for this individual.  There is an additional family history of cancer in Cindy Alexander's father (dx at 28 yo) and paternal aunt (dx at 36 yo).  We discussed that Cindy Alexander, her father, and/or her aunt may consider genetic testing for hereditary cancer conditions.  We encouraged Cindy Alexander to discuss her brother's genetic testing as this could significantly impact her testing plan and recurrence risk.  Genetic considerations were reviewed with the family. They are aware that we have over 20,000 genes, each with an important role in the body. All of the genes are packaged into structures called chromosomes. We have two copies of every chromosome- one that is inherited from each parent- and thus two copies of every gene. Given Cindy Alexander's features, concern for a genetic cause of her symptoms has arisen. If a specific genetic abnormality can be identified, it may help provide further insight into prognosis, management, and recurrence risk.  At this time, there is no specific genetic diagnosis evident in Cindy Alexander. Given her complicated medical and developmental history, a broad approach to genetic testing is recommended. Specifically, we recommend exome sequencing (ES). Exome sequencing assesses all of the coding regions (exons) of the genes for any variants that could be associated with an individual's symptoms.  Ms. Bottino is interested in pursuing this testing today and would like to know of secondary findings as well. We discussed that results of genetic testing, including secondary findings, may impact her Cindy Alexander  status; however, Ms. Menton was still interested in pursuing this  testing.  The consent form, possible results (positive, negative, and variant of uncertain significance), and expected timeline were reviewed. A financial assistance application was completed and will be sent in with the testing. Parental samples will be submitted for comparison.  A test kit for Cindy Alexander and her parents was sent home to be completed and sent to [facility] for Exome Sequencing Results are expected in 1-2 months, at which point we will reach out with more information.  Recommendations: [facility] Exome Sequencing Continue follow-up with other healthcare providers as recommended.  Date: 05/07/2024 Total time spent: 85 minutes Genetic Counselor-only time: 40 minutes  Time spent includes face to face and non-face to face care for the patient on the date of this encounter (history, genetic counseling, coordination of care, data gathering and/or documentation as outlined).   Philbert Brave MS [facility] Certified Genetic Counselor [facility] [employer]

## 2024-05-08 ENCOUNTER — Encounter: Payer: Self-pay | Admitting: Medical Genetics

## 2024-05-29 ENCOUNTER — Telehealth: Payer: Self-pay | Admitting: Genetic Counselor

## 2024-05-29 ENCOUNTER — Encounter: Payer: Self-pay | Admitting: Genetic Counselor

## 2024-05-29 NOTE — Telephone Encounter (Signed)
 Spoke with Cindy Alexander regarding the results of her recent genetic testing.   Cindy Alexander was seen in the [facility] clinic on 05/07/2024 at 23 yo due to a personal history of seizures and autism spectrum disorder.  After evaluation, genetic testing was ordered for Cindy Alexander including exome sequencing.   The [facility] Exome Sequencing was negative/normal.  At this time, we have not identified a genetic cause for Cindy Alexander's symptoms.  No changes to medical management or testing of other family members are recommended based on these results.  Re-analysis of exome sequencing data may be considered 18-24 months after initial testing.  Cindy Alexander will contact the office if she is interested in re-analysis at that time.  We also discussed that Cindy Alexander insurance is likely going to deny coverage of her testing based on communications we have received.  She has already applied for patient financial assistance which will reduce the cost of her testing to $250 or less.  She was encouraged to answer any calls from [facility] to confirm her financial assistance approval.  Cindy Alexander expressed understanding of these results and was encouraged to reach out with any further questions.  The test report has been released to the family and is attached to the associated order.   Carolynne Citron, MS [facility] Certified Genetic Counselor
